# Patient Record
Sex: Female | Born: 1965 | Hispanic: Yes | Marital: Married | Smoking: Never smoker
Health system: Southern US, Community
[De-identification: ages and names within clinical notes are randomized; demographics above are authoritative.]

## PROBLEM LIST (undated history)

## (undated) DIAGNOSIS — M1990 Unspecified osteoarthritis, unspecified site: Secondary | ICD-10-CM

## (undated) DIAGNOSIS — E039 Hypothyroidism, unspecified: Secondary | ICD-10-CM

## (undated) DIAGNOSIS — E079 Disorder of thyroid, unspecified: Secondary | ICD-10-CM

## (undated) DIAGNOSIS — K219 Gastro-esophageal reflux disease without esophagitis: Secondary | ICD-10-CM

## (undated) HISTORY — PX: OTHER SURGICAL HISTORY: SHX169

## (undated) HISTORY — PX: KNEE ARTHROSCOPY: SUR90

---

## 2009-06-18 ENCOUNTER — Emergency Department: Payer: Self-pay | Admitting: Emergency Medicine

## 2009-11-05 ENCOUNTER — Ambulatory Visit: Payer: Self-pay | Admitting: Family Medicine

## 2011-01-04 ENCOUNTER — Ambulatory Visit: Payer: Self-pay

## 2011-08-18 ENCOUNTER — Ambulatory Visit: Payer: Self-pay | Admitting: Primary Care

## 2012-02-13 ENCOUNTER — Emergency Department: Payer: Self-pay | Admitting: Emergency Medicine

## 2012-05-02 ENCOUNTER — Ambulatory Visit: Payer: Self-pay | Admitting: Primary Care

## 2012-05-19 ENCOUNTER — Ambulatory Visit: Payer: Self-pay | Admitting: Primary Care

## 2012-10-30 ENCOUNTER — Ambulatory Visit: Payer: Self-pay | Admitting: Orthopedic Surgery

## 2012-11-06 ENCOUNTER — Ambulatory Visit: Payer: Self-pay | Admitting: Orthopedic Surgery

## 2014-05-09 NOTE — Op Note (Signed)
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   PROCEDURE: Right knee arthroscopy, partial medial meniscectomy.   ANESTHESIA: General.   SURGEON: Kennedy BuckerMichael Dauntae Derusha, M.D.   DESCRIPTION OF PROCEDURE: The patient was brought to the operating room and after adequate anesthesia was obtained, the right leg was prepped and draped in the usual sterile fashion with arthroscopic legholder and tourniquet applied. The tourniquet was not required during the procedure.   After patient identification and timeout procedures were completed, an inferolateral portal was made and the arthroscope was introduced. Initial inspection revealed moderate patellofemoral degenerative change on both patella and trochlea. There are no loose bodies present within the suprapatellar pouch. Coming around medially, there was a small plica band but it did not appear to impinge on any of the articular cartilage and was left alone. Coming around into the medial compartment , an inferomedial portal was made.   On probing, there was a degenerative tear involving most of the posterior third of the meniscus as well as a parrot-beak-type tear at the junction of the middle and posterior thirds of the meniscus that could be displaced into the joint. There was partial-thickness loss of the articular cartilage on both femoral and tibial sides with fissuring but no exposed bone. The ACL was intact and the lateral compartment was essentially normal with normal meniscus to probing and normal-appearing articular cartilage.   The gutters were free of any loose bodies. A meniscal punch was used to debride the meniscus back to stable margin with a side biter to get the flap tear at the junction of the medial and posterior thirds.  After this was completed, an ArthroCare wand was used to smooth the edges of this meniscal tear, preprocedure and postprocedure pictures having been obtained.   An arthroscopic cannula was placed through the inferomedial portal at this time to thoroughly irrigate out the knee and remove meniscal fragments. After thorough irrigation, all instrumentation was withdrawn and the knee closed with simple interrupted 4-0 nylon. Then, 20 mL of 0.5% Sensorcaine with epinephrine was infiltrated into the area of portals for postoperative analgesia. Xeroform, 4 x 4's, Webril and Ace wrap were applied.   ESTIMATED BLOOD LOSS: Minimal.   COMPLICATIONS: None.   SPECIMEN: None.    ____________________________ Samantha SchullerMichael J. Omer Monter, MD mjm:np D: 11/06/2012 21:00:42 ET T: 11/06/2012 21:46:34 ET JOB#: [id number]  cc: Samantha SchullerMichael J. Lloyd Cullinan, MD, <Dictator> Samantha SchullerMICHAEL J Zaccheaus Storlie MD ELECTRONICALLY SIGNED 11/07/2012 7:52

## 2014-12-17 ENCOUNTER — Emergency Department
Admission: EM | Admit: 2014-12-17 | Discharge: 2014-12-17 | Disposition: A | Discharge status: Home / Self Care | Payer: Self-pay | Attending: Emergency Medicine | Admitting: Emergency Medicine

## 2014-12-17 ENCOUNTER — Emergency Department: Payer: Self-pay

## 2014-12-17 DIAGNOSIS — M25552 Pain in left hip: Secondary | ICD-10-CM | POA: Insufficient documentation

## 2014-12-17 DIAGNOSIS — J209 Acute bronchitis, unspecified: Secondary | ICD-10-CM | POA: Insufficient documentation

## 2014-12-17 HISTORY — DX: Disorder of thyroid, unspecified: E07.9

## 2014-12-17 MED ORDER — GUAIFENESIN-CODEINE 100-10 MG/5ML PO SOLN: 10.0000 mL | ORAL | Status: DC | PRN

## 2014-12-17 MED ORDER — AZITHROMYCIN 250 MG PO TABS: ORAL_TABLET | ORAL | Status: DC

## 2014-12-17 MED ORDER — MELOXICAM 15 MG PO TABS: 15.0000 mg | ORAL_TABLET | Freq: Every day | ORAL | Status: DC

## 2014-12-17 NOTE — Discharge Instructions (Signed)
Bronquitis aguda (Acute Bronchitis) La bronquitis es una inflamacin de las vas respiratorias que se extienden desde la trquea [facility] pulmones (bronquios). La inflamacin produce la formacin de mucosidad. Esto produce tos, que es el sntoma ms frecuente de la bronquitis.  Cuando la bronquitis es [place], generalmente comienza de [place] sbita y desaparece luego de un par de semanas. El hbito de fumar, las alergias y el asma pueden empeorar la bronquitis. Los episodios repetidos de bronquitis pueden causar ms problemas pulmonares.  CAUSAS La causa ms frecuente de bronquitis aguda es el mismo virus que produce el resfro. El virus puede propagarse de Ardelia Mems persona a la otra (contagioso) a travs de la tos y los estornudos, y al tocar objetos contaminados. [place].  Cristy Hilts.  Tos con mucosidad.  Dolores [employer] cuerpo.  Congestin en el pecho.  Escalofros.  Falta de aire.  Dolor de Investment banker, operational. DIAGNSTICO  La bronquitis aguda en general se diagnostica con un examen fsico. El mdico tambin le har preguntas sobre su historia clnica. En algunos casos se indican otros estudios, como radiografas, para Clinical research associate.  TRATAMIENTO  La bronquitis aguda generalmente desaparece en un par de semanas. Con frecuencia, no es Systems analyst. Los medicamentos se indican para aliviar la fiebre o la tos. Generalmente, no es necesario el uso de antibiticos, pero pueden recetarse en ciertas ocasiones. En algunos casos, se recomienda el uso de un inhalador para mejorar la falta de aire y Aeronautical engineer tos. Un vaporizador de aire fro podr ayudarlo a [employer] bronquiales y Armed forces technical officer su eliminacin.  INSTRUCCIONES PARA EL CUIDADO EN EL HOGAR  Descanse lo suficiente.  Beba lquidos en abundancia para mantener la orina de color claro o amarillo plido (excepto que padezca una enfermedad que requiera la restriccin de lquidos). El aumento  de lquidos puede ayudar a que las secreciones respiratorias (esputo) sean menos espesas y a reducir la congestin del pecho, y Mining engineer deshidratacin.  Tome los medicamentos solamente como se lo haya indicado el mdico.  Si le recetaron antibiticos, asegrese de terminarlos, incluso si comienza a sentirse mejor.  Evite fumar o aspirar el humo de otros fumadores. La exposicin al humo del cigarrillo o a irritantes qumicos har que la bronquitis empeore. Si fuma, considere el uso de goma de Higher education careers adviser o la aplicacin de parches en la piel que contengan nicotina para Public house manager los sntomas de abstinencia. Si deja de fumar, sus pulmones se curarn ms rpido.  Reduzca la probabilidad de otro episodio de bronquitis aguda lavando sus manos con frecuencia, evitando a las personas que tengan sntomas y tratando de no tocarse las manos con la boca, la nariz o los ojos.  Concurra a todas las visitas de control como se lo haya indicado el mdico. SOLICITE ATENCIN MDICA SI: Los sntomas no mejoran despus de una semana de [place].  SOLICITE ATENCIN MDICA DE INMEDIATO SI:  Comienza a tener fiebre o escalofros cada vez ms intensos.  Siente dolor en el pecho.  Le falta el aire de manera preocupante.  La flema tiene [place].  Se deshidrata.  Se desmaya o siente que va a desmayarse de forma repetida.  Tiene vmitos que se repiten.  Tiene un dolor de cabeza intenso. ASEGRESE DE QUE:   Comprende estas instrucciones.  Controlar su afeccin.  Recibir ayuda de inmediato si no mejora o si empeora.   Esta informacin no tiene Marine scientist el consejo del mdico. Asegrese de hacerle al mdico cualquier  pregunta que tenga.   Document Released: 01/03/2005 Document Revised: 01/24/2014 Elsevier Interactive Patient Education 2016 [employer]  Dolor de cadera (Hip Pain) La cadera es la articulacin entre la parte superior de las piernas y la parte inferior de la pelvis. Los  TransMontaignehuesos, Research scientist (physical sciences)el cartlago, los tendones y los msculos de la articulacin de la cadera trabajan arduamente cada da para sostener el peso del cuerpo y Nurse, children'spermitir el desplazamiento. El Engineer, miningdolor de cadera puede tener distintos grados, desde un dolor leve hasta un dolor intenso en uno o ambos lados de la cadera. El dolor puede sentirse en la parte interna de la articulacin de la cadera, cerca de la ingle, o en la parte externa, cerca de los glteos y la parte superior de los muslos. Tambin puede estar acompaado de hinchazn o entumecimiento.  INSTRUCCIONES PARA EL CUIDADO EN EL HOGAR   Tome los medicamentos solamente como se lo haya indicado el mdico.  Aplique hielo sobre la zona lesionada.  Ponga el hielo en una bolsa plstica.  Coloque una toalla entre la piel y la bolsa de hielo.  Aplique el hielo de 3 a 4 veces por da, durante 15 a 20minutos en cada aplicacin.  Mantenga la pierna levantada (elevada) siempre que sea posible, para reducir la hinchazn.  Evite las actividades que Teaching laboratory techniciancausan dolor.  Siga los ejercicios especficos segn las indicaciones del mdico.  Duerma con una almohada entre las piernas del lado que le sea ms cmodo.  Anote la frecuencia con la que tiene dolor en la cadera, la ubicacin del dolor y lo que siente. SOLICITE ATENCIN MDICA SI:   No puede apoyar el peso del cuerpo DTE Energy Companysobre la pierna.  La cadera est enrojecida o hinchada, o muy dolorida con la palpacin.  El dolor o la hinchazn continan o empeoran despus de 1semana.  Tiene una creciente dificultad para caminar.  Tiene fiebre. SOLICITE ATENCIN MDICA DE INMEDIATO SI:   Se ha cado.  El dolor y la hinchazn de la cadera aumentan de repente. ASEGRESE DE QUE:   Comprende estas instrucciones.  Controlar su afeccin.  Recibir ayuda de inmediato si no mejora o si empeora.   Esta informacin no tiene Theme park managercomo fin reemplazar el consejo del mdico. Asegrese de hacerle al mdico cualquier pregunta que  tenga.   Document Released: 05/20/2013 Elsevier Interactive Patient Education [employer]2016 Elsevier Inc.

## 2014-12-17 NOTE — ED Notes (Signed)
Pt arrives POV c/o coughing and left sided hip pain when coughing. Pt states that it began last night. Pt reports yellow mucous production when coughing. Pt alert and oriented X4, active, cooperative, pt in NAD. RR even and unlabored, color WNL.

## 2014-12-17 NOTE — ED Provider Notes (Signed)
[facility] Emergency Department Provider Note  ____________________________________________  Time seen: Approximately 7:38 AM  I have reviewed the triage vital signs and the nursing notes.   HISTORY  Chief Complaint Cough and Hip Pain    HPI Samantha Avery is a 49 y.o. female who presents for evaluation of coughing associated with left-sided hip pain. States it began 3 days ago with yellow mucus production. Denies a runny nose congestion or sore throat. Denies any fever chills.   Past Medical History  Diagnosis Date  . Thyroid disease     There are no active problems to display for this patient.   History reviewed. No pertinent past surgical history.  Current Outpatient Rx  Name  Route  Sig  Dispense  Refill  . azithromycin (ZITHROMAX Z-PAK) 250 MG tablet      Take 2 tablets (500 mg) on  Day 1,  followed by 1 tablet (250 mg) once daily on Days 2 through 5.   6 each   0   . guaiFENesin-codeine 100-10 MG/5ML syrup   Oral   Take 10 mLs by mouth every 4 (four) hours as needed for cough.   180 mL   0   . meloxicam (MOBIC) 15 MG tablet   Oral   Take 1 tablet (15 mg total) by mouth daily.   30 tablet   0     Allergies Review of patient's allergies indicates no known allergies.  No family history on file.  Social History Social History  Substance Use Topics  . Smoking status: Never Smoker   . Smokeless tobacco: None  . Alcohol Use: No    Review of Systems Constitutional: No fever/chills Eyes: No visual changes. ENT: No sore throat. Cardiovascular: Denies chest pain. Respiratory: Denies shortness of breath. Positive for cough Gastrointestinal: No abdominal pain.  No nausea, no vomiting.  No diarrhea.  No constipation. Genitourinary: Negative for dysuria. Musculoskeletal: Positive for left hip pain Skin: Negative for rash. Neurological: Negative for headaches, focal weakness or numbness.  10-point ROS otherwise  negative.  ____________________________________________   PHYSICAL EXAM:  VITAL SIGNS: ED Triage Vitals  Enc Vitals Group     BP 12/17/14 0732 147/83 mmHg     Pulse Rate 12/17/14 0732 80     Resp 12/17/14 0732 18     Temp 12/17/14 0732 98.3 F (36.8 C)     Temp Source 12/17/14 0732 Oral     SpO2 12/17/14 0732 97 %     Weight 12/17/14 0732 210 lb (95.255 kg)     Height 12/17/14 0732 5' (1.524 m)     Head Cir --      Peak Flow --      Pain Score 12/17/14 0732 9     Pain Loc --      Pain Edu? --      Excl. in GC? --     Constitutional: Alert and oriented. Well appearing and in no acute distress. Eyes: Conjunctivae are normal. PERRL. EOMI. Head: Atraumatic. Nose: No congestion/rhinnorhea. Mouth/Throat: Mucous membranes are moist.  Oropharynx non-erythematous. Neck: No stridor.   Cardiovascular: Normal rate, regular rhythm. Grossly normal heart sounds.  Good peripheral circulation. Respiratory: Normal respiratory effort.  No retractions. Lungs CTAB. Gastrointestinal: Soft and nontender. No distention. No abdominal bruits. No CVA tenderness. Musculoskeletal: Point tenderness left outer hip. Neurologic:  Normal speech and language. No gross focal neurologic deficits are appreciated. No gait instability. Skin:  Skin is warm, dry and intact. No rash  noted. Psychiatric: Mood and affect are normal. Speech and behavior are normal.  ____________________________________________   LABS (all labs ordered are listed, but only abnormal results are displayed)  Labs Reviewed - No data to display ____________________________________________  RADIOLOGY  Acute bronchitic changes. No pneumonia or atelectasis. ____________________________________________   PROCEDURES  Procedure(s) performed: None  Critical Care performed: No  ____________________________________________   INITIAL IMPRESSION / ASSESSMENT AND PLAN / ED COURSE  Pertinent labs & imaging results that were  available during my care of the patient were reviewed by me and considered in my medical decision making (see chart for details).  Acute bronchitis with left hip pain. Rx given for Zithromax, Robitussin-AC, and meloxicam. Patient to follow up with PCP or return to the ER with any worsening symptomology. Patient voices no other emergency medical complaints at this visit. ____________________________________________   FINAL CLINICAL IMPRESSION(S) / ED DIAGNOSES  Final diagnoses:  Acute bronchitis, unspecified organism  Arthralgia of left hip      Evangeline Dakin, PA-C 12/17/14 [id number]  Arnaldo Natal, MD 12/19/14 [phone]

## 2015-10-08 ENCOUNTER — Other Ambulatory Visit: Payer: Self-pay | Admitting: Family Medicine

## 2015-10-08 DIAGNOSIS — Z1231 Encounter for screening mammogram for malignant neoplasm of breast: Secondary | ICD-10-CM

## 2015-10-28 ENCOUNTER — Ambulatory Visit
Admission: RE | Admit: 2015-10-28 | Discharge: 2015-10-28 | Disposition: A | Discharge status: Home / Self Care | Payer: 59 | Source: Ambulatory Visit | Attending: Family Medicine | Admitting: Family Medicine

## 2015-10-28 DIAGNOSIS — Z1231 Encounter for screening mammogram for malignant neoplasm of breast: Secondary | ICD-10-CM | POA: Diagnosis not present

## 2017-10-07 ENCOUNTER — Other Ambulatory Visit
Admission: RE | Admit: 2017-10-07 | Discharge: 2017-10-07 | Disposition: A | Discharge status: Home / Self Care | Payer: 59 | Source: Ambulatory Visit | Attending: Pediatrics | Admitting: Pediatrics

## 2017-10-07 DIAGNOSIS — I83891 Varicose veins of right lower extremities with other complications: Secondary | ICD-10-CM | POA: Diagnosis not present

## 2017-10-07 LAB — FIBRIN DERIVATIVES D-DIMER (ARMC ONLY): FIBRIN DERIVATIVES D-DIMER (ARMC): 401.81 ng{FEU}/mL (ref 0.00–499.00)

## 2017-10-30 ENCOUNTER — Other Ambulatory Visit: Payer: Self-pay | Admitting: Orthopedic Surgery

## 2017-10-30 DIAGNOSIS — M25561 Pain in right knee: Principal | ICD-10-CM

## 2017-10-30 DIAGNOSIS — G8929 Other chronic pain: Secondary | ICD-10-CM

## 2017-10-30 DIAGNOSIS — M1711 Unilateral primary osteoarthritis, right knee: Secondary | ICD-10-CM

## 2017-11-09 ENCOUNTER — Other Ambulatory Visit: Payer: Self-pay | Admitting: Internal Medicine

## 2017-11-09 ENCOUNTER — Ambulatory Visit
Admission: RE | Admit: 2017-11-09 | Discharge: 2017-11-09 | Disposition: A | Discharge status: Home / Self Care | Payer: 59 | Source: Ambulatory Visit | Attending: Internal Medicine | Admitting: Internal Medicine

## 2017-11-09 DIAGNOSIS — R05 Cough: Secondary | ICD-10-CM | POA: Insufficient documentation

## 2017-11-09 DIAGNOSIS — R053 Chronic cough: Secondary | ICD-10-CM

## 2017-12-06 ENCOUNTER — Ambulatory Visit
Admission: RE | Admit: 2017-12-06 | Discharge: 2017-12-06 | Disposition: A | Discharge status: Home / Self Care | Payer: 59 | Source: Ambulatory Visit | Attending: Orthopedic Surgery | Admitting: Orthopedic Surgery

## 2017-12-06 ENCOUNTER — Ambulatory Visit: Admission: RE | Admit: 2017-12-06 | Payer: 59 | Source: Ambulatory Visit

## 2017-12-06 DIAGNOSIS — M25561 Pain in right knee: Secondary | ICD-10-CM | POA: Insufficient documentation

## 2017-12-06 DIAGNOSIS — M1711 Unilateral primary osteoarthritis, right knee: Secondary | ICD-10-CM | POA: Insufficient documentation

## 2017-12-06 DIAGNOSIS — G8929 Other chronic pain: Secondary | ICD-10-CM

## 2018-01-19 ENCOUNTER — Encounter
Admission: RE | Admit: 2018-01-19 | Discharge: 2018-01-19 | Disposition: A | Discharge status: Home / Self Care | Payer: 59 | Source: Ambulatory Visit | Attending: Orthopedic Surgery | Admitting: Orthopedic Surgery

## 2018-01-19 ENCOUNTER — Other Ambulatory Visit: Payer: Self-pay

## 2018-01-19 DIAGNOSIS — Z01812 Encounter for preprocedural laboratory examination: Secondary | ICD-10-CM | POA: Diagnosis present

## 2018-01-19 HISTORY — DX: Hypothyroidism, unspecified: E03.9

## 2018-01-19 HISTORY — DX: Unspecified osteoarthritis, unspecified site: M19.90

## 2018-01-19 LAB — CBC
HEMATOCRIT: 41.8 % (ref 36.0–46.0)
Hemoglobin: 13.2 g/dL (ref 12.0–15.0)
MCH: 27.8 pg (ref 26.0–34.0)
MCHC: 31.6 g/dL (ref 30.0–36.0)
MCV: 88.2 fL (ref 80.0–100.0)
Platelets: 372 10*3/uL (ref 150–400)
RBC: 4.74 MIL/uL (ref 3.87–5.11)
RDW: 13.8 % (ref 11.5–15.5)
WBC: 10.1 10*3/uL (ref 4.0–10.5)
nRBC: 0 % (ref 0.0–0.2)

## 2018-01-19 LAB — BASIC METABOLIC PANEL
Anion gap: 9 (ref 5–15)
BUN: 10 mg/dL (ref 6–20)
CALCIUM: 8.9 mg/dL (ref 8.9–10.3)
CO2: 24 mmol/L (ref 22–32)
CREATININE: 0.53 mg/dL (ref 0.44–1.00)
Chloride: 107 mmol/L (ref 98–111)
GFR calc non Af Amer: >60 mL/min (ref >60)
Glucose, Bld: 82 mg/dL (ref 70–99)
Potassium: 3.6 mmol/L (ref 3.5–5.1)
Sodium: 140 mmol/L (ref 135–145)

## 2018-01-19 LAB — TYPE AND SCREEN
ABO/RH(D): A POS
ANTIBODY SCREEN: NEGATIVE

## 2018-01-19 LAB — URINALYSIS, ROUTINE W REFLEX MICROSCOPIC
Bilirubin Urine: NEGATIVE
GLUCOSE, UA: NEGATIVE mg/dL
Hgb urine dipstick: NEGATIVE
KETONES UR: NEGATIVE mg/dL
LEUKOCYTES UA: NEGATIVE
Nitrite: NEGATIVE
PH: 6 (ref 5.0–8.0)
Protein, ur: NEGATIVE mg/dL
SPECIFIC GRAVITY, URINE: 1.005 (ref 1.005–1.030)

## 2018-01-19 LAB — PROTIME-INR
INR: 0.97
Prothrombin Time: 12.8 seconds (ref 11.4–15.2)

## 2018-01-19 LAB — APTT: aPTT: 30 seconds (ref 24–36)

## 2018-01-19 LAB — SURGICAL PCR SCREEN
MRSA, PCR: NEGATIVE
Staphylococcus aureus: NEGATIVE

## 2018-01-19 LAB — SEDIMENTATION RATE: SED RATE: 11 mm/h (ref 0–30)

## 2018-01-19 NOTE — Patient Instructions (Signed)
Your procedure is scheduled on: Thurs. 01/25/18 Su procedimiento est programado para: Report to Day Surgery. Presntese a: To find out your arrival time please call [phone] between 1PM - 3PM on Wed. 01/24/18. Para saber su hora de llegada por favor llame al [phone] entre la 1PM - 3PM el da:   Remember: Instructions that are not followed completely may result in serious medical risk, up to and including death,  or upon the discretion of your surgeon and anesthesiologist your surgery may need to be rescheduled.  Recuerde: Las instrucciones que no se siguen completamente Armed forces logistics/support/administrative officer en un riesgo de salud grave, incluyendo hasta  la [place] o a discrecin de su cirujano y Scientific laboratory technician, su ciruga se puede posponer.   __X_ 1.Do not eat food after midnight the night before your procedure. No    gum chewing or hard candies. You may drink clear liquids up to 2 hours     before you are scheduled to arrive for your surgery- DO not drink clear     Liquids within 2 hours of the start of your surgery.     Clear Liquids include:    water, apple juice without pulp, clear carbohydrate drink such as    Clearfast of [place]ade, Black Coffee or Tea (Do not add anything to coffee or tea).      No coma nada despus de la medianoche de la noche anterior a su    procedimiento. No coma chicles ni caramelos duros. Puede tomar    lquidos claros hasta 2 horas antes de su hora programada de llegada al     hospital para su procedimiento. No tome lquidos claros durante el     transcurso de las 2 horas de su llegada programada al hospital para su     procedimiento, ya que esto puede llevar a que su procedimiento se    retrase o tenga que volver a Magazine features editor.  Los lquidos claros incluyen:          - Agua o jugo de [place] sin pulpa          - Bebidas claras con carbohidratos como ClearFast o Gatorade          - Caf negro o t claro (sin leche, sin cremas, no agregue nada al caf ni al t)  No  tome nada que no est en esta lista.  Los pacientes con diabetes tipo 1 y tipo 2 solo deben Printmaker.  Llame a la clnica de PreCare o a la unidad de Same Day Surgery si  tiene alguna pregunta sobre estas instrucciones.              _X__ 2.Do Not Smoke or use e-cigarettes For 24 Hours Prior to Your Surgery.    Do not use any chewable tobacco products for at least 6   hours prior to surgery.    No fume ni use cigarrillos electrnicos durante las 24 horas previas    a su [place].  No use ningn producto de tabaco masticable durante   al menos 6 horas antes de la [place].     __X_ 3. No alcohol for 24 hours before or after surgery.    No tome alcohol durante las 24 horas antes ni despus de la [place].   ____4. Bring all medications with you on the day of surgery if instructed.    Lleve todos los medicamentos con usted el da de su ciruga si se le    ha indicado as.  _x___ 5. Notify your doctor if there is any change in your medical condition (cold,fever, infections).    Informe a su mdico si hay algn cambio en su condicin mdica  (resfriado, fiebre, infecciones).   Do not wear jewelry, make-up, hairpins, clips or nail polish.  No use joyas, maquillajes, pinzas/ganchos para el cabello ni esmalte de uas.  Do not wear lotions, powders, or perfumes. You may wear deodorant.  No use lociones, polvos o perfumes.  Puede usar desodorante.    Do not shave 48 hours prior to surgery. Men may shave face and neck.  No se afeite 48 horas antes de la [place].  Los hombres pueden [employer] cara  y el cuello.   Do not bring valuables to the hospital.   No lleve objetos de valor al hospital.  [facility] is not responsible for any belongings or valuables.  [place] no se hace responsable de ningn tipo de pertenencias u objetos de Licensed conveyancer.               Contacts, dentures or bridgework may not be worn into surgery.  Los lentes de [place], las dentaduras postizas o puentes no se pueden  usar en la [place].   Leave your suitcase in the car. After surgery it may be brought to your room.  Deje su maleta en el auto.  Despus de la ciruga podr traerla a su habitacin.   For patients admitted to the hospital, discharge time is determined by your  treatment team.  Para los pacientes que sean ingresados al hospital, el tiempo en el cual se le  dar de alta es determinado por su equipo de [place].   Patients discharged the day of surgery will not be allowed to drive home. A los pacientes que se les da de alta el mismo da de la ciruga no se les permitir conducir a Higher education careers adviser.   Please read over the following fact sheets that you were given: Por favor lea las siguientes hojas de informacin que le dieron:      __x__ Take these medicines the morning of surgery with A SIP OF WATER:          [place] medicinas la maana de la ciruga con UN SORBO DE AGUA:  1. levothyroxine (SYNTHROID, LEVOTHROID) 100 MCG tablet  2.   3.   4.       5.  6.  ____ Fleet Enema (as directed)          Enema de Fleet (segn lo indicado)    _x___ Use CHG Soap as directed          Utilice el jabn de CHG segn lo indicado  ____ Use inhalers on the day of surgery          Use los inhaladores el da de la ciruga  ____ Stop metformin 2 days prior to surgery          Deje de tomar el metformin 2 das antes de la ciruga    ____ Take 1/2 of usual insulin dose the night before surgery and none on the morning of surgery           Tome la mitad de la dosis habitual de insulina la noche antes de la [place] y no tome nada en la maana de la             ciruga  ____ Stop Coumadin/Plavix/aspirin on           Deje de tomar  el Coumadin/Plavix/aspirina el da:  ____ Stop Anti-inflammatories on           Deje de tomar antiinflamatorios el da:   ____ Stop supplements until after surgery            Deje de tomar suplementos hasta despus de la ciruga  ____ Bring C-Pap to the hospital           Sylvan CheeseLleve el C-Pap al hospital    Nurse Shawna OrleansMelanie [phone]  Medication with dosage

## 2018-01-20 LAB — URINE CULTURE: CULTURE: NO GROWTH

## 2018-01-24 MED ORDER — CEFAZOLIN SODIUM-DEXTROSE 2-4 GM/100ML-% IV SOLN
2.0000 g | Freq: Once | INTRAVENOUS | Status: AC
  Administered 2018-01-25: 2 g via INTRAVENOUS

## 2018-01-24 MED ORDER — TRANEXAMIC ACID-NACL 1000-0.7 MG/100ML-% IV SOLN
1000.0000 mg | INTRAVENOUS | Status: DC
  Filled 2018-01-24: qty 100

## 2018-01-25 ENCOUNTER — Inpatient Hospital Stay: Payer: 59 | Admitting: Certified Registered"

## 2018-01-25 ENCOUNTER — Other Ambulatory Visit: Payer: Self-pay

## 2018-01-25 ENCOUNTER — Encounter
Admission: RE | Disposition: A | Discharge status: Home Health | Payer: Self-pay | Source: Home / Self Care | Attending: Orthopedic Surgery

## 2018-01-25 ENCOUNTER — Inpatient Hospital Stay
Admission: RE | Admit: 2018-01-25 | Discharge: 2018-01-30 | DRG: 470 | Disposition: A | Discharge status: Home Health | Payer: 59 | Attending: Orthopedic Surgery | Admitting: Orthopedic Surgery

## 2018-01-25 ENCOUNTER — Inpatient Hospital Stay: Payer: 59

## 2018-01-25 ENCOUNTER — Encounter: Payer: Self-pay | Admitting: *Deleted

## 2018-01-25 DIAGNOSIS — E039 Hypothyroidism, unspecified: Secondary | ICD-10-CM | POA: Diagnosis present

## 2018-01-25 DIAGNOSIS — M1711 Unilateral primary osteoarthritis, right knee: Secondary | ICD-10-CM | POA: Diagnosis present

## 2018-01-25 DIAGNOSIS — Z79899 Other long term (current) drug therapy: Secondary | ICD-10-CM | POA: Diagnosis not present

## 2018-01-25 DIAGNOSIS — I2699 Other pulmonary embolism without acute cor pulmonale: Secondary | ICD-10-CM | POA: Diagnosis not present

## 2018-01-25 DIAGNOSIS — Z6841 Body Mass Index (BMI) 40.0 and over, adult: Secondary | ICD-10-CM | POA: Diagnosis not present

## 2018-01-25 DIAGNOSIS — Z96651 Presence of right artificial knee joint: Secondary | ICD-10-CM

## 2018-01-25 DIAGNOSIS — G8918 Other acute postprocedural pain: Secondary | ICD-10-CM

## 2018-01-25 DIAGNOSIS — K219 Gastro-esophageal reflux disease without esophagitis: Secondary | ICD-10-CM | POA: Diagnosis present

## 2018-01-25 HISTORY — DX: Gastro-esophageal reflux disease without esophagitis: K21.9

## 2018-01-25 HISTORY — PX: TOTAL KNEE ARTHROPLASTY: SHX125

## 2018-01-25 LAB — POCT PREGNANCY, URINE: PREG TEST UR: NEGATIVE

## 2018-01-25 LAB — ABO/RH: ABO/RH(D): A POS

## 2018-01-25 SURGERY — ARTHROPLASTY, KNEE, TOTAL
Anesthesia: Spinal | Laterality: Right

## 2018-01-25 MED ORDER — METHOCARBAMOL 500 MG PO TABS
500.0000 mg | ORAL_TABLET | Freq: Four times a day (QID) | ORAL | Status: DC | PRN
  Administered 2018-01-25 – 2018-01-26 (×2): 500 mg via ORAL
  Filled 2018-01-25 (×2): qty 1

## 2018-01-25 MED ORDER — MORPHINE SULFATE (PF) 10 MG/ML IV SOLN
INTRAVENOUS | Status: AC
  Filled 2018-01-25: qty 1

## 2018-01-25 MED ORDER — ZOLPIDEM TARTRATE 5 MG PO TABS: 5.0000 mg | ORAL_TABLET | Freq: Every evening | ORAL | Status: DC | PRN

## 2018-01-25 MED ORDER — PHENYLEPHRINE HCL 10 MG/ML IJ SOLN
INTRAMUSCULAR | Status: AC
  Filled 2018-01-25: qty 1

## 2018-01-25 MED ORDER — GENTAMICIN SULFATE 40 MG/ML IJ SOLN
INTRAMUSCULAR | Status: AC
  Filled 2018-01-25: qty 14

## 2018-01-25 MED ORDER — ACETAMINOPHEN 500 MG PO TABS
1000.0000 mg | ORAL_TABLET | Freq: Four times a day (QID) | ORAL | Status: AC
  Administered 2018-01-25 – 2018-01-26 (×4): 1000 mg via ORAL
  Filled 2018-01-25 (×4): qty 2

## 2018-01-25 MED ORDER — MIDAZOLAM HCL 2 MG/2ML IJ SOLN
INTRAMUSCULAR | Status: AC
  Filled 2018-01-25: qty 2

## 2018-01-25 MED ORDER — FAMOTIDINE 20 MG PO TABS
20.0000 mg | ORAL_TABLET | Freq: Once | ORAL | Status: AC
  Administered 2018-01-25: 20 mg via ORAL

## 2018-01-25 MED ORDER — KETOROLAC TROMETHAMINE 30 MG/ML IJ SOLN
INTRAMUSCULAR | Status: AC
  Filled 2018-01-25: qty 1

## 2018-01-25 MED ORDER — SODIUM CHLORIDE 0.9 % IV SOLN
INTRAVENOUS | Status: DC | PRN
  Administered 2018-01-25: 60 mL

## 2018-01-25 MED ORDER — ALUM & MAG HYDROXIDE-SIMETH 200-200-20 MG/5ML PO SUSP: 30.0000 mL | ORAL | Status: DC | PRN

## 2018-01-25 MED ORDER — SODIUM CHLORIDE (PF) 0.9 % IJ SOLN
INTRAMUSCULAR | Status: AC
  Filled 2018-01-25: qty 100

## 2018-01-25 MED ORDER — NEOMYCIN-POLYMYXIN B GU 40-200000 IR SOLN
Status: AC
  Filled 2018-01-25: qty 1

## 2018-01-25 MED ORDER — FAMOTIDINE 20 MG PO TABS
ORAL_TABLET | ORAL | Status: AC
  Filled 2018-01-25: qty 1

## 2018-01-25 MED ORDER — PROPOFOL 500 MG/50ML IV EMUL
INTRAVENOUS | Status: DC | PRN
  Administered 2018-01-25: 35 ug/kg/min via INTRAVENOUS

## 2018-01-25 MED ORDER — NORETHINDRONE ACETATE 5 MG PO TABS
5.0000 mg | ORAL_TABLET | Freq: Every day | ORAL | Status: DC
  Administered 2018-01-25 – 2018-01-30 (×6): 5 mg via ORAL
  Filled 2018-01-25 (×6): qty 1

## 2018-01-25 MED ORDER — LACTATED RINGERS IV SOLN
INTRAVENOUS | Status: DC
  Administered 2018-01-25: 07:00:00 via INTRAVENOUS

## 2018-01-25 MED ORDER — PROPOFOL 10 MG/ML IV BOLUS
INTRAVENOUS | Status: DC | PRN
  Administered 2018-01-25: 30 mg via INTRAVENOUS

## 2018-01-25 MED ORDER — PROMETHAZINE HCL 25 MG/ML IJ SOLN: 6.2500 mg | INTRAMUSCULAR | Status: DC | PRN

## 2018-01-25 MED ORDER — LEVOTHYROXINE SODIUM 100 MCG PO TABS
100.0000 ug | ORAL_TABLET | Freq: Every day | ORAL | Status: DC
  Administered 2018-01-26 – 2018-01-30 (×5): 100 ug via ORAL
  Filled 2018-01-25 (×6): qty 1

## 2018-01-25 MED ORDER — EPHEDRINE SULFATE 50 MG/ML IJ SOLN
INTRAMUSCULAR | Status: DC | PRN
  Administered 2018-01-25: 10 mg via INTRAVENOUS

## 2018-01-25 MED ORDER — PANTOPRAZOLE SODIUM 40 MG PO TBEC
40.0000 mg | DELAYED_RELEASE_TABLET | Freq: Every day | ORAL | Status: DC
  Administered 2018-01-25 – 2018-01-30 (×6): 40 mg via ORAL
  Filled 2018-01-25 (×6): qty 1

## 2018-01-25 MED ORDER — PROPOFOL 500 MG/50ML IV EMUL
INTRAVENOUS | Status: AC
  Filled 2018-01-25: qty 50

## 2018-01-25 MED ORDER — LIDOCAINE HCL (PF) 2 % IJ SOLN
INTRAMUSCULAR | Status: DC | PRN
  Administered 2018-01-25: 50 mg

## 2018-01-25 MED ORDER — MAGNESIUM HYDROXIDE 400 MG/5ML PO SUSP
30.0000 mL | Freq: Every day | ORAL | Status: DC | PRN
  Administered 2018-01-26 – 2018-01-29 (×2): 30 mL via ORAL
  Filled 2018-01-25 (×2): qty 30

## 2018-01-25 MED ORDER — OXYCODONE HCL 5 MG PO TABS
5.0000 mg | ORAL_TABLET | ORAL | Status: DC | PRN
  Administered 2018-01-26 – 2018-01-30 (×6): 10 mg via ORAL
  Filled 2018-01-25 (×4): qty 2
  Filled 2018-01-25: qty 1
  Filled 2018-01-25: qty 2
  Filled 2018-01-25: qty 1
  Filled 2018-01-25: qty 2

## 2018-01-25 MED ORDER — BUPIVACAINE HCL (PF) 0.5 % IJ SOLN
INTRAMUSCULAR | Status: DC | PRN
  Administered 2018-01-25: 3 mL via INTRATHECAL

## 2018-01-25 MED ORDER — SODIUM CHLORIDE 0.9 % IV SOLN
INTRAVENOUS | Status: DC
  Administered 2018-01-25 – 2018-01-26 (×2): via INTRAVENOUS

## 2018-01-25 MED ORDER — CEFAZOLIN SODIUM-DEXTROSE 2-4 GM/100ML-% IV SOLN
INTRAVENOUS | Status: AC
  Filled 2018-01-25: qty 100

## 2018-01-25 MED ORDER — OXYCODONE HCL 5 MG PO TABS
10.0000 mg | ORAL_TABLET | ORAL | Status: DC | PRN
  Administered 2018-01-25: 10 mg via ORAL
  Administered 2018-01-26 – 2018-01-29 (×7): 15 mg via ORAL
  Filled 2018-01-25 (×8): qty 3

## 2018-01-25 MED ORDER — PHENOL 1.4 % MT LIQD
1.0000 | OROMUCOSAL | Status: DC | PRN
  Filled 2018-01-25: qty 177

## 2018-01-25 MED ORDER — LIDOCAINE HCL (PF) 2 % IJ SOLN
INTRAMUSCULAR | Status: AC
  Filled 2018-01-25: qty 10

## 2018-01-25 MED ORDER — CEFAZOLIN SODIUM-DEXTROSE 2-4 GM/100ML-% IV SOLN
2.0000 g | Freq: Four times a day (QID) | INTRAVENOUS | Status: AC
  Administered 2018-01-25 (×3): 2 g via INTRAVENOUS
  Filled 2018-01-25 (×3): qty 100

## 2018-01-25 MED ORDER — BUPIVACAINE LIPOSOME 1.3 % IJ SUSP
INTRAMUSCULAR | Status: AC
  Filled 2018-01-25: qty 20

## 2018-01-25 MED ORDER — MORPHINE SULFATE 10 MG/ML IJ SOLN
INTRAMUSCULAR | Status: DC | PRN
  Administered 2018-01-25: 10 mg

## 2018-01-25 MED ORDER — FENTANYL CITRATE (PF) 100 MCG/2ML IJ SOLN
INTRAMUSCULAR | Status: AC
  Filled 2018-01-25: qty 2

## 2018-01-25 MED ORDER — ONDANSETRON HCL 4 MG/2ML IJ SOLN
4.0000 mg | Freq: Four times a day (QID) | INTRAMUSCULAR | Status: DC | PRN
  Administered 2018-01-25: 4 mg via INTRAVENOUS
  Filled 2018-01-25: qty 2

## 2018-01-25 MED ORDER — PHENYLEPHRINE HCL 10 MG/ML IJ SOLN
INTRAMUSCULAR | Status: DC | PRN
  Administered 2018-01-25 (×4): 100 ug via INTRAVENOUS

## 2018-01-25 MED ORDER — BISACODYL 5 MG PO TBEC
5.0000 mg | DELAYED_RELEASE_TABLET | Freq: Every day | ORAL | Status: DC | PRN
  Administered 2018-01-26 – 2018-01-27 (×2): 5 mg via ORAL
  Filled 2018-01-25 (×2): qty 1

## 2018-01-25 MED ORDER — LIDOCAINE HCL URETHRAL/MUCOSAL 2 % EX GEL
CUTANEOUS | Status: AC
  Filled 2018-01-25: qty 5

## 2018-01-25 MED ORDER — FENTANYL CITRATE (PF) 100 MCG/2ML IJ SOLN
25.0000 ug | INTRAMUSCULAR | Status: DC | PRN
  Administered 2018-01-25: 25 ug via INTRAVENOUS

## 2018-01-25 MED ORDER — TRANEXAMIC ACID-NACL 1000-0.7 MG/100ML-% IV SOLN
INTRAVENOUS | Status: DC | PRN
  Administered 2018-01-25: 1000 mg via INTRAVENOUS

## 2018-01-25 MED ORDER — MIDAZOLAM HCL 5 MG/5ML IJ SOLN
INTRAMUSCULAR | Status: DC | PRN
  Administered 2018-01-25 (×2): 2 mg via INTRAVENOUS

## 2018-01-25 MED ORDER — METOCLOPRAMIDE HCL 5 MG/ML IJ SOLN: 5.0000 mg | Freq: Three times a day (TID) | INTRAMUSCULAR | Status: DC | PRN

## 2018-01-25 MED ORDER — GABAPENTIN 300 MG PO CAPS
300.0000 mg | ORAL_CAPSULE | Freq: Three times a day (TID) | ORAL | Status: DC
  Administered 2018-01-25 – 2018-01-30 (×16): 300 mg via ORAL
  Filled 2018-01-25 (×16): qty 1

## 2018-01-25 MED ORDER — ONDANSETRON HCL 4 MG PO TABS: 4.0000 mg | ORAL_TABLET | Freq: Four times a day (QID) | ORAL | Status: DC | PRN

## 2018-01-25 MED ORDER — TRAMADOL HCL 50 MG PO TABS
50.0000 mg | ORAL_TABLET | Freq: Four times a day (QID) | ORAL | Status: DC
  Administered 2018-01-25 – 2018-01-30 (×18): 50 mg via ORAL
  Filled 2018-01-25 (×19): qty 1

## 2018-01-25 MED ORDER — FENTANYL CITRATE (PF) 100 MCG/2ML IJ SOLN
INTRAMUSCULAR | Status: DC | PRN
  Administered 2018-01-25 (×2): 50 ug via INTRAVENOUS

## 2018-01-25 MED ORDER — HYDROMORPHONE HCL 1 MG/ML IJ SOLN: 0.5000 mg | INTRAMUSCULAR | Status: DC | PRN

## 2018-01-25 MED ORDER — KETOROLAC TROMETHAMINE 30 MG/ML IJ SOLN
INTRAMUSCULAR | Status: DC | PRN
  Administered 2018-01-25: 30 mg

## 2018-01-25 MED ORDER — BUPIVACAINE-EPINEPHRINE (PF) 0.25% -1:200000 IJ SOLN
INTRAMUSCULAR | Status: AC
  Filled 2018-01-25: qty 30

## 2018-01-25 MED ORDER — DOCUSATE SODIUM 100 MG PO CAPS
100.0000 mg | ORAL_CAPSULE | Freq: Two times a day (BID) | ORAL | Status: DC
  Administered 2018-01-25 – 2018-01-30 (×11): 100 mg via ORAL
  Filled 2018-01-25 (×11): qty 1

## 2018-01-25 MED ORDER — DIPHENHYDRAMINE HCL 12.5 MG/5ML PO ELIX
12.5000 mg | ORAL_SOLUTION | ORAL | Status: DC | PRN
  Administered 2018-01-27 – 2018-01-28 (×2): 12.5 mg via ORAL
  Filled 2018-01-25 (×2): qty 5

## 2018-01-25 MED ORDER — ASPIRIN 81 MG PO CHEW
81.0000 mg | CHEWABLE_TABLET | Freq: Two times a day (BID) | ORAL | Status: DC
  Administered 2018-01-26 – 2018-01-30 (×9): 81 mg via ORAL
  Filled 2018-01-25 (×9): qty 1

## 2018-01-25 MED ORDER — METOCLOPRAMIDE HCL 10 MG PO TABS: 5.0000 mg | ORAL_TABLET | Freq: Three times a day (TID) | ORAL | Status: DC | PRN

## 2018-01-25 MED ORDER — ACETAMINOPHEN 325 MG PO TABS
325.0000 mg | ORAL_TABLET | Freq: Four times a day (QID) | ORAL | Status: DC | PRN
  Administered 2018-01-28: 650 mg via ORAL
  Filled 2018-01-25: qty 2

## 2018-01-25 MED ORDER — MENTHOL 3 MG MT LOZG
1.0000 | LOZENGE | OROMUCOSAL | Status: DC | PRN
  Filled 2018-01-25: qty 9

## 2018-01-25 MED ORDER — METHOCARBAMOL 1000 MG/10ML IJ SOLN
500.0000 mg | Freq: Four times a day (QID) | INTRAVENOUS | Status: DC | PRN
  Filled 2018-01-25: qty 5

## 2018-01-25 MED ORDER — SODIUM CHLORIDE 0.9 % IV SOLN
INTRAVENOUS | Status: DC | PRN
  Administered 2018-01-25: 25 ug/min via INTRAVENOUS

## 2018-01-25 MED ORDER — GLYCOPYRROLATE 0.2 MG/ML IJ SOLN
INTRAMUSCULAR | Status: AC
  Filled 2018-01-25: qty 1

## 2018-01-25 MED ORDER — GLYCOPYRROLATE 0.2 MG/ML IJ SOLN
INTRAMUSCULAR | Status: DC | PRN
  Administered 2018-01-25: 0.2 mg via INTRAVENOUS

## 2018-01-25 MED ORDER — BUPIVACAINE-EPINEPHRINE (PF) 0.25% -1:200000 IJ SOLN
INTRAMUSCULAR | Status: DC | PRN
  Administered 2018-01-25: 30 mL

## 2018-01-25 SURGICAL SUPPLY — 68 items
BANDAGE ACE 6X5 VEL STRL LF (GAUZE/BANDAGES/DRESSINGS) ×3 IMPLANT
BLADE SAW 1 (BLADE) ×3 IMPLANT
BLOCK CUTTING FEMUR 3 RT MED (MISCELLANEOUS) ×2 IMPLANT
BLOCK CUTTING FEMUR 3+ RT MED (MISCELLANEOUS) ×2 IMPLANT
BLOCK CUTTING TIBIAL 3 RT (MISCELLANEOUS) ×2 IMPLANT
BLOCK CUTTING TIBIAL 4 RT MIS (MISCELLANEOUS) ×2 IMPLANT
CANISTER SUCT 1200ML W/VALVE (MISCELLANEOUS) ×3 IMPLANT
CANISTER SUCT 3000ML PPV (MISCELLANEOUS) ×6 IMPLANT
CEMENT HV SMART SET (Cement) ×6 IMPLANT
CHLORAPREP W/TINT 26ML (MISCELLANEOUS) ×6 IMPLANT
COOLER POLAR GLACIER W/PUMP (MISCELLANEOUS) ×3 IMPLANT
COVER WAND RF STERILE (DRAPES) ×3 IMPLANT
CUFF TOURN 24 STER (MISCELLANEOUS) IMPLANT
CUFF TOURN 30 STER DUAL PORT (MISCELLANEOUS) IMPLANT
DRAPE SHEET LG 3/4 BI-LAMINATE (DRAPES) ×6 IMPLANT
ELECT CAUTERY BLADE 6.4 (BLADE) ×3 IMPLANT
ELECT REM PT RETURN 9FT ADLT (ELECTROSURGICAL) ×3
ELECTRODE REM PT RTRN 9FT ADLT (ELECTROSURGICAL) ×1 IMPLANT
FEMORAL COMP SZ3P RT SPHERE (Femur) ×2 IMPLANT
FEMUR BONE MODEL (MISCELLANEOUS) ×2 IMPLANT
GAUZE PETRO XEROFOAM 1X8 (MISCELLANEOUS) ×3 IMPLANT
GAUZE SPONGE 4X4 12PLY STRL (GAUZE/BANDAGES/DRESSINGS) ×3 IMPLANT
GLOVE BIOGEL PI IND STRL 9 (GLOVE) ×1 IMPLANT
GLOVE BIOGEL PI INDICATOR 9 (GLOVE) ×2
GLOVE INDICATOR 8.0 STRL GRN (GLOVE) ×3 IMPLANT
GLOVE SURG ORTHO 8.0 STRL STRW (GLOVE) ×3 IMPLANT
GLOVE SURG SYN 9.0  PF PI (GLOVE) ×2
GLOVE SURG SYN 9.0 PF PI (GLOVE) ×1 IMPLANT
GOWN SRG 2XL LVL 4 RGLN SLV (GOWNS) ×1 IMPLANT
GOWN STRL NON-REIN 2XL LVL4 (GOWNS) ×2
GOWN STRL REUS W/ TWL LRG LVL3 (GOWN DISPOSABLE) ×1 IMPLANT
GOWN STRL REUS W/ TWL XL LVL3 (GOWN DISPOSABLE) ×1 IMPLANT
GOWN STRL REUS W/TWL LRG LVL3 (GOWN DISPOSABLE) ×2
GOWN STRL REUS W/TWL XL LVL3 (GOWN DISPOSABLE) ×2
HOLDER FOLEY CATH W/STRAP (MISCELLANEOUS) ×3 IMPLANT
HOOD PEEL AWAY FLYTE STAYCOOL (MISCELLANEOUS) ×6 IMPLANT
INSERT TIBIAL SZ 3 RT FLEX (Insert) ×2 IMPLANT
KIT TURNOVER KIT A (KITS) ×3 IMPLANT
KNIFE SCULPS 14X20 (INSTRUMENTS) ×3 IMPLANT
NDL SAFETY ECLIPSE 18X1.5 (NEEDLE) ×1 IMPLANT
NDL SPNL 18GX3.5 QUINCKE PK (NEEDLE) ×1 IMPLANT
NDL SPNL 20GX3.5 QUINCKE YW (NEEDLE) ×1 IMPLANT
NEEDLE HYPO 18GX1.5 SHARP (NEEDLE) ×2
NEEDLE SPNL 18GX3.5 QUINCKE PK (NEEDLE) ×3 IMPLANT
NEEDLE SPNL 20GX3.5 QUINCKE YW (NEEDLE) ×3 IMPLANT
NS IRRIG 1000ML POUR BTL (IV SOLUTION) ×3 IMPLANT
PACK TOTAL KNEE (MISCELLANEOUS) ×3 IMPLANT
PAD WRAPON POLAR KNEE (MISCELLANEOUS) ×1 IMPLANT
PATELLA RESURFACING MEDACTA 02 (Bone Implant) ×2 IMPLANT
PULSAVAC PLUS IRRIG FAN TIP (DISPOSABLE) ×3
SCALPEL PROTECTED #10 DISP (BLADE) ×6 IMPLANT
SOL .9 NS 3000ML IRR  AL (IV SOLUTION) ×2
SOL .9 NS 3000ML IRR UROMATIC (IV SOLUTION) ×1 IMPLANT
STAPLER SKIN PROX 35W (STAPLE) ×3 IMPLANT
STEM EXTENSION 11MMX30MM (Stem) ×2 IMPLANT
SUCTION FRAZIER HANDLE 10FR (MISCELLANEOUS) ×2
SUCTION TUBE FRAZIER 10FR DISP (MISCELLANEOUS) ×1 IMPLANT
SUT DVC 2 QUILL PDO  T11 36X36 (SUTURE) ×2
SUT DVC 2 QUILL PDO T11 36X36 (SUTURE) ×1 IMPLANT
SUT V-LOC 90 ABS DVC 3-0 CL (SUTURE) ×3 IMPLANT
SYR 20CC LL (SYRINGE) ×3 IMPLANT
SYR 50ML LL SCALE MARK (SYRINGE) ×6 IMPLANT
TIP FAN IRRIG PULSAVAC PLUS (DISPOSABLE) ×1 IMPLANT
TOWEL OR 17X26 4PK STRL BLUE (TOWEL DISPOSABLE) ×3 IMPLANT
TOWER CARTRIDGE SMART MIX (DISPOSABLE) ×3 IMPLANT
TRAY FOLEY MTR SLVR 16FR STAT (SET/KITS/TRAYS/PACK) ×3 IMPLANT
TRAY TIB FX RT SZ 3 (Joint) ×2 IMPLANT
WRAPON POLAR PAD KNEE (MISCELLANEOUS) ×3

## 2018-01-25 NOTE — Care Management Note (Signed)
Case Management Note  Patient Details  Name: Samantha Avery MRN: [id number] Date of Birth: 08/13/1965  Subjective/Objective:                   Met with patient and Daughter to discuss discharge Patient has a rolling walker at home She needs a 3 in 1 for home [place] with [facility] that she needs a 3 in 1 [facility] Patient uses [facility] and can afford medications Patient has transportation Patient lives with 2 daughters and 3 grand children Patient has follow up appointment with surgeon   Action/Plan:  Notified [facility] jason that patiwent needs a 3 in 1  Expected Discharge Date:                  Expected Discharge Plan:     In-House Referral:     Discharge planning Services  CM Consult  Post Acute Care Choice:  Durable Medical Equipment Choice offered to:     DME Arranged:  3-N-1 DME Agency:  [place]:  PT [place]:  [facility] [employer]  Status of Service:  In process, will continue to follow  If discussed at Long Length of Stay Meetings, dates discussed:    Additional Comments:  Su Hilt, RN 01/25/2018, 1:57 PM

## 2018-01-25 NOTE — Evaluation (Signed)
Physical Therapy Evaluation Patient Details Name: Samantha JaegerJuliana G Avery MRN: [id number] DOB: July 06, 1965 Today's Date: 01/25/2018   History of Present Illness  Pt is a 53 y/o F s/p R TKA.    Clinical Impression  Pt is s/p TKA resulting in the deficits listed below (see PT Problem List). Pt was very pleasant and reports PTA was ambulating without AD with no recent falls.  She was ind with her ADLs as well.  Pt currently requires min guard for safety with all aspects of mobility.  Verbal and tactile cues provided for safety with transfers and improved gait mechanics while ambulating.  Need to clarify if pt has a walker at home or not.  Would recommend Bari RW as pt's body habitus precludes pt's ability to ambulate inside of BOS of regular RW.  Pt will benefit from skilled PT to increase their independence and safety with mobility to allow discharge to the venue listed below.     Follow Up Recommendations Outpatient PT    Equipment Recommendations  Other (comment)(Bari RW, need to clarify if pt already has one or not)    Recommendations for Other Services OT consult     Precautions / Restrictions Precautions Precautions: Fall;Knee Precaution Comments: Instructed pt in no pillow under knee Restrictions Weight Bearing Restrictions: Yes RLE Weight Bearing: Weight bearing as tolerated      Mobility  Bed Mobility Overal bed mobility: Needs Assistance Bed Mobility: Supine to Sit     Supine to sit: Min guard;HOB elevated     General bed mobility comments: Increased time and effort with use of bed rail.  Cues for sequencing.   Transfers Overall transfer level: Needs assistance Equipment used: Rolling walker (2 wheeled) Transfers: Sit to/from Stand Sit to Stand: Min guard         General transfer comment: Cues for proper hand placement with sit<>stand and to keep RW with her for stand>sit.   Ambulation/Gait Ambulation/Gait assistance: Min guard Gait Distance (Feet): 100  Feet Assistive device: Rolling walker (2 wheeled) Gait Pattern/deviations: Step-to pattern;Decreased stance time - right;Decreased step length - left;Decreased weight shift to right;Antalgic Gait velocity: decreased   General Gait Details: Cues for improved R knee F.  Pt remains steady using RW.    Stairs            Wheelchair Mobility    Modified Rankin (Stroke Patients Only)       Balance Overall balance assessment: Needs assistance Sitting-balance support: No upper extremity supported;Feet supported Sitting balance-Leahy Scale: Good     Standing balance support: Single extremity supported;During functional activity Standing balance-Leahy Scale: Poor Standing balance comment: Pt relies on at least 1UE support for static and dynamic activity                             Pertinent Vitals/Pain Pain Assessment: Faces Faces Pain Scale: Hurts even more Pain Location: R knee Pain Descriptors / Indicators: Grimacing;Guarding;Moaning Pain Intervention(s): Limited activity within patient's tolerance;Monitored during session;Repositioned;Premedicated before session;Utilized relaxation techniques;Ice applied    Home Living Family/patient expects to be discharged to:: Private residence Living Arrangements: Children;Other relatives(two daughters and three grandchildren) Available Help at Discharge: Family;Available 24 hours/day(daughter taking off work to stay with pt at d/c) Type of Home: Apartment Home Access: Level entry     Home Layout: One level        Prior Function Level of Independence: Independent         Comments: Ambulating  without AD.  No recent falls.  Ind with ADLs.      Hand Dominance        Extremity/Trunk Assessment   Upper Extremity Assessment Upper Extremity Assessment: Overall WFL for tasks assessed    Lower Extremity Assessment Lower Extremity Assessment: RLE deficits/detail RLE Deficits / Details: Unable to formally assess  s/p R TKA.  Functionall strength is at least 3/5       Communication   Communication: Prefers language other than English(Spanish speaking)  Cognition Arousal/Alertness: Awake/alert Behavior During Therapy: WFL for tasks assessed/performed Overall Cognitive Status: Within Functional Limits for tasks assessed                                        General Comments General comments (skin integrity, edema, etc.): Phone interpreter utilized: ZOXWRUE #[id number] [id number]    Exercises Total Joint Exercises Ankle Circles/Pumps: AROM;Both;10 reps;Supine Quad Sets: Strengthening;Both;10 reps;Supine Knee Flexion: AAROM;Right;Other reps (comment);Seated(1 rep with 15 second hold) Goniometric ROM: -8 to 74 deg   Assessment/Plan    PT Assessment Patient needs continued PT services  PT Problem List Decreased strength;Decreased range of motion;Decreased activity tolerance;Decreased balance;Decreased mobility;Pain;Decreased knowledge of use of DME;Decreased safety awareness;Decreased knowledge of precautions       PT Treatment Interventions DME instruction;Gait training;Functional mobility training;Therapeutic activities;Therapeutic exercise;Balance training;Neuromuscular re-education;Patient/family education;Modalities    PT Goals (Current goals can be found in the Care Plan section)  Acute Rehab PT Goals Patient Stated Goal: to improve independence PT Goal Formulation: With patient Time For Goal Achievement: 02/08/18 Potential to Achieve Goals: Good    Frequency BID   Barriers to discharge        Co-evaluation               AM-PAC PT "6 Clicks" Mobility  Outcome Measure Help needed turning from your back to your side while in a flat bed without using bedrails?: A Little Help needed moving from lying on your back to sitting on the side of a flat bed without using bedrails?: A Little Help needed moving to and from a bed to a chair (including a wheelchair)?: A Little Help  needed standing up from a chair using your arms (e.g., wheelchair or bedside chair)?: A Little Help needed to walk in hospital room?: A Little Help needed climbing 3-5 steps with a railing? : A Little 6 Click Score: 18    End of Session Equipment Utilized During Treatment: Gait belt Activity Tolerance: Patient tolerated treatment well Patient left: in chair;with call bell/phone within reach;with chair alarm set;with family/visitor present;Other (comment)(polar care and towel roll) Nurse Communication: Mobility status PT Visit Diagnosis: Pain;Other abnormalities of gait and mobility (R26.89);Unsteadiness on feet (R26.81);Muscle weakness (generalized) (M62.81) Pain - Right/Left: Right Pain - part of body: Knee    Time: 1450-1549 PT Time Calculation (min) (ACUTE ONLY): 59 min   Charges:   PT Evaluation $PT Eval Low Complexity: 1 Low PT Treatments $Gait Training: 8-22 mins $Therapeutic Exercise: 8-22 mins $Therapeutic Activity: 8-22 mins        Encarnacion ChuAshley  PT, DPT 01/25/2018, 4:28 PM

## 2018-01-25 NOTE — [place] FL2 (Signed)
[place] MEDICAID FL2 LEVEL OF CARE SCREENING TOOL     IDENTIFICATION  Patient Name: Samantha JaegerJuliana G Vasquez-Riverol Birthdate: 1965/05/05 Sex: female Admission Date (Current Location): 01/25/2018  [place]   Facility and Address:  [facility], [address], [place], [place] [address]      Provider Number: [id number]  Attending Physician Name and Address:  Kennedy BuckerMenz, Michael, MD  Relative Name and Phone Number:       Current Level of Care: Hospital Recommended Level of Care: Skilled Nursing Facility Prior Approval Number:    Date Approved/Denied:   PASRR Number: [id number])  Discharge Plan: SNF    Current Diagnoses: Patient Active Problem List   Diagnosis Date Noted  . S/P TKR (total knee replacement) using cement, right 01/25/2018    Orientation RESPIRATION BLADDER Height & Weight     Self, Time, Situation, Place  Normal Continent Weight: 239 lb 13.8 oz (108.8 kg) Height:  5' (152.4 cm)  BEHAVIORAL SYMPTOMS/MOOD NEUROLOGICAL BOWEL NUTRITION STATUS      Continent Diet(Diet: Regular )  AMBULATORY STATUS COMMUNICATION OF NEEDS Skin   Extensive Assist Verbally Surgical wounds, Wound Vac(Incision: Right Knee, Provena Wound Vac. )                       Personal Care Assistance Level of Assistance  Bathing, Feeding, Dressing Bathing Assistance: Limited assistance Feeding assistance: Independent Dressing Assistance: Limited assistance     Functional Limitations Info  Sight, Hearing, Speech Sight Info: Adequate Hearing Info: Adequate Speech Info: Adequate    SPECIAL CARE FACTORS FREQUENCY  PT (By licensed PT), OT (By licensed OT)     PT Frequency: (5) OT Frequency: (5)            Contractures      Additional Factors Info  Code Status, Allergies Code Status Info: (Full Code. ) Allergies Info: (No Known Allegies. )           Current Medications (01/25/2018):  This is the current hospital active  medication list Current Facility-Administered Medications  Medication Dose Route Frequency Provider Last Rate Last Dose  . 0.9 %  sodium chloride infusion   Intravenous Continuous Kennedy BuckerMenz, Michael, MD 75 mL/hr at 01/25/18 1121    . acetaminophen (TYLENOL) tablet 1,000 mg  1,000 mg Oral Q6H Kennedy BuckerMenz, Michael, MD   1,000 mg at 01/25/18 1125  . [START ON 01/26/2018] acetaminophen (TYLENOL) tablet 325-650 mg  325-650 mg Oral Q6H PRN Kennedy BuckerMenz, Michael, MD      . alum & mag hydroxide-simeth (MAALOX/MYLANTA) 200-200-20 MG/5ML suspension 30 mL  30 mL Oral Q4H PRN Kennedy BuckerMenz, Michael, MD      . aspirin chewable tablet 81 mg  81 mg Oral BID Kennedy BuckerMenz, Michael, MD      . bisacodyl (DULCOLAX) EC tablet 5 mg  5 mg Oral Daily PRN Kennedy BuckerMenz, Michael, MD      . ceFAZolin (ANCEF) IVPB 2g/100 mL premix  2 g Intravenous Q6H Kennedy BuckerMenz, Michael, MD 200 mL/hr at 01/25/18 1124 2 g at 01/25/18 1124  . diphenhydrAMINE (BENADRYL) 12.5 MG/5ML elixir 12.5-25 mg  12.5-25 mg Oral Q4H PRN Kennedy BuckerMenz, Michael, MD      . docusate sodium (COLACE) capsule 100 mg  100 mg Oral BID Kennedy BuckerMenz, Michael, MD   100 mg at 01/25/18 1126  . famotidine (PEPCID) 20 MG tablet           . fentaNYL (SUBLIMAZE) 100 MCG/2ML injection           .  gabapentin (NEURONTIN) capsule 300 mg  300 mg Oral TID Kennedy BuckerMenz, Michael, MD      . HYDROmorphone (DILAUDID) injection 0.5-1 mg  0.5-1 mg Intravenous Q4H PRN Kennedy BuckerMenz, Michael, MD      . levothyroxine (SYNTHROID, LEVOTHROID) tablet 100 mcg  100 mcg Oral Q0600 Kennedy BuckerMenz, Michael, MD      . magnesium hydroxide (MILK OF MAGNESIA) suspension 30 mL  30 mL Oral Daily PRN Kennedy BuckerMenz, Michael, MD      . menthol-cetylpyridinium (CEPACOL) lozenge 3 mg  1 lozenge Oral PRN Kennedy BuckerMenz, Michael, MD       Or  . phenol (CHLORASEPTIC) mouth spray 1 spray  1 spray Mouth/Throat PRN Kennedy BuckerMenz, Michael, MD      . methocarbamol (ROBAXIN) tablet 500 mg  500 mg Oral Q6H PRN Kennedy BuckerMenz, Michael, MD       Or  . methocarbamol (ROBAXIN) 500 mg in dextrose 5 % 50 mL IVPB  500 mg Intravenous Q6H PRN Kennedy BuckerMenz,  Michael, MD      . metoCLOPramide (REGLAN) tablet 5-10 mg  5-10 mg Oral Q8H PRN Kennedy BuckerMenz, Michael, MD       Or  . metoCLOPramide (REGLAN) injection 5-10 mg  5-10 mg Intravenous Q8H PRN Kennedy BuckerMenz, Michael, MD      . norethindrone (AYGESTIN) tablet 5 mg  5 mg Oral Daily Kennedy BuckerMenz, Michael, MD      . ondansetron [facility](ZOFRAN) tablet 4 mg  4 mg Oral Q6H PRN Kennedy BuckerMenz, Michael, MD       Or  . ondansetron [facility](ZOFRAN) injection 4 mg  4 mg Intravenous Q6H PRN Kennedy BuckerMenz, Michael, MD      . oxyCODONE (Oxy IR/ROXICODONE) immediate release tablet 10-15 mg  10-15 mg Oral Q4H PRN Kennedy BuckerMenz, Michael, MD      . oxyCODONE (Oxy IR/ROXICODONE) immediate release tablet 5-10 mg  5-10 mg Oral Q4H PRN Kennedy BuckerMenz, Michael, MD      . pantoprazole (PROTONIX) EC tablet 40 mg  40 mg Oral Daily Kennedy BuckerMenz, Michael, MD   40 mg at 01/25/18 1127  . traMADol (ULTRAM) tablet 50 mg  50 mg Oral Q6H Kennedy BuckerMenz, Michael, MD   50 mg at 01/25/18 1125  . zolpidem (AMBIEN) tablet 5 mg  5 mg Oral QHS PRN Kennedy BuckerMenz, Michael, MD         Discharge Medications: Please see discharge summary for a list of discharge medications.  Relevant Imaging Results:  Relevant Lab Results:   Additional Information (SSN: [id number])  Vilda Zollner, Darleen CrockerBailey M, LCSW

## 2018-01-25 NOTE — Progress Notes (Signed)
Spanish interpreter at bedside during PACU stay.

## 2018-01-25 NOTE — H&P (Signed)
Reviewed paper H+P, will be scanned into chart. No changes noted.  

## 2018-01-25 NOTE — Op Note (Signed)
01/25/2018  9:25 AM  PATIENT:  Samantha Avery  53 y.o. female  PRE-OPERATIVE DIAGNOSIS:  PRIMARY OSTEOARTHRITIS OF RIGHT KNEE  POST-OPERATIVE DIAGNOSIS:  PRIMARY OSTEOARTHRITIS OF RIGHT KNEE  PROCEDURE:  Procedure(s): TOTAL KNEE ARTHROPLASTY (Right)  SURGEON: Leitha Schuller, MD  Assistant Cranston Neighbor, PA-C  ANESTHESIA:   spinal  EBL:  Total I/O In: 800 [I.V.:800] Out: 250 [Urine:200; Blood:50]  BLOOD ADMINISTERED:none  DRAINS: none   LOCAL MEDICATIONS USED:  MARCAINE    and OTHER Toradol and morphine Exparel  SPECIMEN:  No Specimen  DISPOSITION OF SPECIMEN:  N/A  COUNTS:  YES  TOURNIQUET: 66 minutes at 300 mmHg  IMPLANTS: Medacta GM K sphere system right 3+ femur 3 tibia, short stem with 11 mm insert and 2 patella, all components cemented  DICTATION: .Dragon Dictation   patient was brought to the operating room and after adequatespinalanesthesia was obtained therightleg was prepped and draped in the usual sterile fashion. After patient identification and timeout procedure were completed, a midline skin incision was made followed by a medial parapatellar arthrotomy with a tourniquet raised after bone cuts were started. There is mildsynovitis throughout the knee with eburnated bone in themedial compartment and patellofemoral joint with heldarthritis to thelateral compartment. The fat pad and ACL and PCL were excised. The proximal tibia was exposed and the proximal tibia MYKNEEcutting guide applied and proximal tibia cut carried out. The distal femoral cut was carried out with theMYKNEEguideas well and adequate resection noted for theextension gap. The size3+cutting guide was applied,anterior posterior andchamfer cuts made on the femur with the meniscusremoved at this time. With the PCL completelyreally released the 3tibiatemplate applied to the tibia the proximal tibia was prepared for short stem. trials placed, 3+femur placed with a  11 mm insert giving good stability throughout a range of motion and full extension obtained without difficulty. The distal femoral drill holes were made withcut for the trochlear groove made long with PS drill hole. These trials were removed and the patella was cut using the patellar cutting guide drill holes were made in the patella sized to a size 2. the bony surfaces were thoroughly irrigated and dried. The tibial component was cemented to place first with thepolyethylene was placed with set screw tightened with a torque screwdriver. The femoral component was placed with excess cement being removed and the knee held in extension as the patellar button was clamped into place. When the cement set the excess amount around the periphery was removed and the patella tracked well . The tourniquet was let down and the knee thoroughly irrigated. The wound was closed with a heavy Quill for the arthrotomy followed by a 3-0 v-LOC subcuticular closure followed by skin staples and covered with incisional wound VAC andPolar Care  PLAN OF CARE: Admit to inpatient   PATIENT DISPOSITION:  PACU - hemodynamically stable.

## 2018-01-25 NOTE — Anesthesia Preprocedure Evaluation (Addendum)
Anesthesia Evaluation  Patient identified by MRN, date of birth, ID band Patient awake    Reviewed: Allergy & Precautions, H&P , NPO status , Patient's Chart, lab work & pertinent test results, reviewed documented beta blocker date and time   History of Anesthesia Complications Negative for: history of anesthetic complications  Airway Mallampati: II  TM Distance: >3 FB Neck ROM: full    Dental  (+) Dental Advidsory Given, Partial Upper   Pulmonary neg shortness of breath, neg sleep apnea, neg COPD, Recent URI , Resolved,           Cardiovascular Exercise Tolerance: Good negative cardio ROS       Neuro/Psych negative neurological ROS  negative psych ROS   GI/Hepatic Neg liver ROS, GERD  ,  Endo/Other  neg diabetesHypothyroidism Morbid obesity  Renal/GU negative Renal ROS  negative genitourinary   Musculoskeletal   Abdominal   Peds  Hematology negative hematology ROS (+)   Anesthesia Other Findings Past Medical History: No date: Arthritis No date: GERD (gastroesophageal reflux disease) No date: Hypothyroidism No date: Thyroid disease   Reproductive/Obstetrics negative OB ROS                             Anesthesia Physical Anesthesia Plan  ASA: III  Anesthesia Plan: Spinal   Post-op Pain Management:    Induction:   PONV Risk Score and Plan: Propofol infusion and TIVA  Airway Management Planned: Natural Airway and Simple Face Mask  Additional Equipment:   Intra-op Plan:   Post-operative Plan:   Informed Consent: I have reviewed the patients History and Physical, chart, labs and discussed the procedure including the risks, benefits and alternatives for the proposed anesthesia with the patient or authorized representative who has indicated his/her understanding and acceptance.   Dental Advisory Given  Plan Discussed with: Anesthesiologist, CRNA and Surgeon  Anesthesia  Plan Comments:        Anesthesia Quick Evaluation

## 2018-01-25 NOTE — Transfer of Care (Signed)
Immediate Anesthesia Transfer of Care Note  Patient: Samantha Avery  Procedure(s) Performed: TOTAL KNEE ARTHROPLASTY (Right )  Patient Location: PACU  Anesthesia Type:Spinal  Level of Consciousness: awake and alert   Airway & Oxygen Therapy: Patient Spontanous Breathing and Patient connected to face mask oxygen  Post-op Assessment: Report given to RN and Post -op Vital signs reviewed and stable  Post vital signs: Reviewed  Last Vitals:  Vitals Value Taken Time  BP 80/53 01/25/2018  9:24 AM  Temp    Pulse 73 01/25/2018  9:25 AM  Resp 16 01/25/2018  9:25 AM  SpO2 100 % 01/25/2018  9:25 AM  Vitals shown include unvalidated device data.  Last Pain:  Vitals:   01/25/18 0606  TempSrc: Tympanic  PainSc: 5          Complications: No apparent anesthesia complications

## 2018-01-25 NOTE — Anesthesia Post-op Follow-up Note (Signed)
Anesthesia QCDR form completed.        

## 2018-01-25 NOTE — Anesthesia Procedure Notes (Signed)
Spinal  Patient location during procedure: OR Staffing Anesthesiologist: Karenz, Andrew, MD Resident/CRNA: Ndea Kilroy, CRNA Performed: resident/CRNA  Preanesthetic Checklist Completed: patient identified, site marked, surgical consent, pre-op evaluation, timeout performed, IV checked, risks and benefits discussed and monitors and equipment checked Spinal Block Patient position: sitting Prep: ChloraPrep and site prepped and draped Patient monitoring: heart rate, continuous pulse ox, blood pressure and cardiac monitor Approach: midline Location: L4-5 Injection technique: single-shot Needle Needle type: Introducer and Pencan  Needle gauge: 24 G Needle length: 9 cm Additional Notes Negative paresthesia. Negative blood return. Positive free-flowing CSF. Expiration date of kit checked and confirmed. Patient tolerated procedure well, without complications.       

## 2018-01-26 ENCOUNTER — Encounter: Payer: Self-pay | Admitting: Orthopedic Surgery

## 2018-01-26 LAB — CBC
HCT: 35.2 % — ABNORMAL LOW (ref 36.0–46.0)
Hemoglobin: 11 g/dL — ABNORMAL LOW (ref 12.0–15.0)
MCH: 27.9 pg (ref 26.0–34.0)
MCHC: 31.3 g/dL (ref 30.0–36.0)
MCV: 89.3 fL (ref 80.0–100.0)
PLATELETS: 289 10*3/uL (ref 150–400)
RBC: 3.94 MIL/uL (ref 3.87–5.11)
RDW: 13.9 % (ref 11.5–15.5)
WBC: 11.2 10*3/uL — ABNORMAL HIGH (ref 4.0–10.5)
nRBC: 0 % (ref 0.0–0.2)

## 2018-01-26 LAB — BASIC METABOLIC PANEL
Anion gap: 5 (ref 5–15)
BUN: 8 mg/dL (ref 6–20)
CO2: 25 mmol/L (ref 22–32)
CREATININE: 0.55 mg/dL (ref 0.44–1.00)
Calcium: 8.2 mg/dL — ABNORMAL LOW (ref 8.9–10.3)
Chloride: 106 mmol/L (ref 98–111)
GFR calc Af Amer: >60 mL/min (ref >60)
GFR calc non Af Amer: >60 mL/min (ref >60)
GLUCOSE: 115 mg/dL — AB (ref 70–99)
Potassium: 3.7 mmol/L (ref 3.5–5.1)
Sodium: 136 mmol/L (ref 135–145)

## 2018-01-26 MED ORDER — BISACODYL 5 MG PO TBEC: 5.0000 mg | DELAYED_RELEASE_TABLET | Freq: Every day | ORAL | 0 refills | Status: AC | PRN

## 2018-01-26 MED ORDER — ASPIRIN 81 MG PO CHEW: 81.0000 mg | CHEWABLE_TABLET | Freq: Two times a day (BID) | ORAL | 0 refills | Status: DC

## 2018-01-26 MED ORDER — OXYCODONE HCL 5 MG PO TABS: 5.0000 mg | ORAL_TABLET | ORAL | 0 refills | Status: AC | PRN

## 2018-01-26 NOTE — Progress Notes (Signed)
Clinical Social Worker (CSW) received SNF consult. PT is recommending outpatient PT. RN case manager aware of above. Please reconsult if future social work needs arise. CSW signing off.   Giulianna Rocha, LCSW [phone]  

## 2018-01-26 NOTE — Care Management (Signed)
RNCM spoke with patient's daughter to confirm that Bedside commode has been delivered by [facility]- it has.  Patient would like to continue with plan for home health PT through [facility].

## 2018-01-26 NOTE — Anesthesia Postprocedure Evaluation (Signed)
Anesthesia Post Note  Patient: Samantha Avery  Procedure(s) Performed: TOTAL KNEE ARTHROPLASTY (Right )  Patient location during evaluation: Nursing Unit Anesthesia Type: Spinal Level of consciousness: oriented and awake and alert Pain management: pain level controlled Vital Signs Assessment: post-procedure vital signs reviewed and stable Respiratory status: spontaneous breathing and respiratory function stable Cardiovascular status: blood pressure returned to baseline and stable Postop Assessment: no headache, no backache, no apparent nausea or vomiting and patient able to bend at knees Anesthetic complications: no     Last Vitals:  Vitals:   01/25/18 2335 01/26/18 0740  BP: 96/60 111/66  Pulse: 62 68  Resp: 15   Temp: 36.9 C 36.9 C  SpO2: 98% 96%    Last Pain:  Vitals:   01/25/18 2116  TempSrc:   PainSc: 0-No pain                 Mathews Argyle P

## 2018-01-26 NOTE — Discharge Summary (Signed)
Physician Discharge Summary  Patient ID: Samantha Avery MRN: [id number] DOB/AGE: March 25, 1965 53 y.o.  Admit date: 01/25/2018 Discharge date: 01/30/2018 Admission Diagnoses:  PRIMARY OSTEOARTHRITIS OF RIGHT KNEE   Discharge Diagnoses: Patient Active Problem List   Diagnosis Date Noted  . S/P TKR (total knee replacement) using cement, right 01/25/2018    Past Medical History:  Diagnosis Date  . Arthritis   . GERD (gastroesophageal reflux disease)   . Hypothyroidism   . Thyroid disease      Transfusion: none   Consultants (if any):   Discharged Condition: Improved  Hospital Course: Samantha Avery is an 53 y.o. female who was admitted 01/25/2018 with a diagnosis of right knee osteoarthritis and went to the operating room on 01/25/2018 and underwent the above named procedures.    Surgeries: Procedure(s): TOTAL KNEE ARTHROPLASTY on 01/25/2018 Patient tolerated the surgery well. Taken to PACU where she was stabilized and then transferred to the orthopedic floor.  Started on aspirin , TEDs, SCDs. Heels elevated on bed with rolled towels. No evidence of DVT. Negative Homan. Physical therapy started on day #1 for gait training and transfer. OT started day #1 for ADL and assisted devices.  Patient's foley was d/c on day #1. Patient's IV was d/c on day #2.  On post op day #3 patient found to have a PE. Patient started on Eliquis. VSS with 2 liters of 02.  On post op day 5 patient was stable, off O2 with normal VSS, patient stable and ready for discharge to home with HHPT.  Implants: Medacta GM K sphere system right 3+ femur 3 tibia, short stem with 11 mm insert and 2 patella, all components cemented  She was given perioperative antibiotics:  Anti-infectives (From admission, onward)   Start     Dose/Rate Route Frequency Ordered Stop   01/25/18 1030  ceFAZolin (ANCEF) IVPB 2g/100 mL premix     2 g 200 mL/hr over 30 Minutes Intravenous Every 6 hours 01/25/18 1027  01/25/18 2254   01/25/18 0550  ceFAZolin (ANCEF) 2-4 GM/100ML-% IVPB    Note to Pharmacy:  Hallaji, Violet   : cabinet override      01/25/18 0550 01/25/18 0737   01/24/18 2215  ceFAZolin (ANCEF) IVPB 2g/100 mL premix     2 g 200 mL/hr over 30 Minutes Intravenous  Once 01/24/18 2206 01/25/18 0807    .  She was given sequential compression devices, early ambulation, and Eliquis, TEDs.  She benefited maximally from the hospital stay and there were no complications.    Recent vital signs:  Vitals:   01/29/18 2332 01/30/18 0749  BP: 140/75 (!) 144/82  Pulse: 84 81  Resp: 19   Temp: 99.5 F (37.5 C) 98.1 F (36.7 C)  SpO2: 97% 93%    Recent laboratory studies:  Lab Results  Component Value Date   HGB 11.0 (L) 01/28/2018   HGB 11.0 (L) 01/26/2018   HGB 13.2 01/19/2018   Lab Results  Component Value Date   WBC 12.2 (H) 01/28/2018   PLT 257 01/28/2018   Lab Results  Component Value Date   INR 0.97 01/19/2018   Lab Results  Component Value Date   NA 135 01/28/2018   K 4.1 01/28/2018   CL 99 01/28/2018   CO2 28 01/28/2018   BUN 7 01/28/2018   CREATININE 0.50 01/28/2018   GLUCOSE 112 (H) 01/28/2018    Discharge Medications:   Allergies as of 01/30/2018   No Known Allergies  Medication List    TAKE these medications   apixaban 5 MG Tabs tablet Commonly known as:  ELIQUIS Take 2 tablets (10 mg total) by mouth 2 (two) times daily for 5 days. After 5 days take 5 mg BID   apixaban 5 MG Tabs tablet Commonly known as:  ELIQUIS Take 1 tablet (5 mg total) by mouth 2 (two) times daily.   bisacodyl 5 MG EC tablet Commonly known as:  DULCOLAX Take 1 tablet (5 mg total) by mouth daily as needed for moderate constipation.   levothyroxine 100 MCG tablet Commonly known as:  SYNTHROID, LEVOTHROID Take 100 mcg by mouth daily before breakfast.   norethindrone 5 MG tablet Commonly known as:  AYGESTIN Take 5 mg by mouth daily.   omeprazole 20 MG capsule Commonly  known as:  PRILOSEC Take 20 mg by mouth daily.   oxyCODONE 5 MG immediate release tablet Commonly known as:  Oxy IR/ROXICODONE Take 1-2 tablets (5-10 mg total) by mouth every 4 (four) hours as needed for moderate pain (pain score 4-6).            Durable Medical Equipment  (From admission, onward)         Start     Ordered   01/25/18 1028  DME 3 n 1  Once     01/25/18 1027   01/25/18 1028  DME Bedside commode  Once    Question:  Patient needs a bedside commode to treat with the following condition  Answer:  S/P TKR (total knee replacement) using cement, right   01/25/18 1027   01/25/18 1028  DME Walker rolling  Once    Question:  Patient needs a walker to treat with the following condition  Answer:  S/P TKR (total knee replacement) using cement, right   01/25/18 1027          Diagnostic Studies: Dg Knee 1-2 Views Right  Result Date: 01/25/2018 CLINICAL DATA:  Right knee replacement EXAM: RIGHT KNEE - 1-2 VIEW COMPARISON:  CT 12/06/2017 FINDINGS: Changes of right knee replacement. Soft tissue and joint space gas. No hardware bony complicating feature. IMPRESSION: Right knee replacement.  No visible complicating feature. Electronically Signed   By: Charlett Nose M.D.   On: 01/25/2018 09:57   Ct Angio Chest Pe W Or Wo Contrast  Addendum Date: 01/28/2018   ADDENDUM REPORT: 01/28/2018 08:43 ADDENDUM: Critical Value/emergent results were called by telephone at the time of interpretation on 01/28/2018 at 8:40 am to Dr. Rosita Kea, who verbally acknowledged these results. Electronically Signed   By: Lupita Raider, M.D.   On: 01/28/2018 08:43   Result Date: 01/28/2018 CLINICAL DATA:  Shortness of breath. EXAM: CT ANGIOGRAPHY CHEST WITH CONTRAST TECHNIQUE: Multidetector CT imaging of the chest was performed using the standard protocol during bolus administration of intravenous contrast. Multiplanar CT image reconstructions and MIPs were obtained to evaluate the vascular anatomy. CONTRAST:  53mL  OMNIPAQUE IOHEXOL 350 MG/ML SOLN COMPARISON:  Radiographs of November 09, 2017. FINDINGS: Cardiovascular: Filling defects are seen in lower lobe branches of both pulmonary arteries consistent with pulmonary emboli. Normal cardiac size. No pericardial effusion. No evidence of thoracic aortic aneurysm or dissection. RV/LV ratio of 1.1 is noted suggesting right heart strain. Mediastinum/Nodes: Small sliding-type hiatal hernia is noted. No adenopathy is noted. 2.7 cm left thyroid nodule is noted. Lungs/Pleura: Lungs are clear. No pleural effusion or pneumothorax. Upper Abdomen: Fatty infiltration of the liver is noted. Musculoskeletal: No chest wall abnormality. No acute or significant  osseous findings. Review of the MIP images confirms the above findings. IMPRESSION: Findings consistent with acute pulmonary emboli in lower lobe branches of bilateral pulmonary arteries. Positive for acute PE with CT evidence of right heart strain (RV/LV Ratio = 1.1) consistent with at least submassive (intermediate risk) PE. The presence of right heart strain has been associated with an increased risk of morbidity and mortality. 2.7 cm left thyroid nodule. Thyroid ultrasound is recommended for further evaluation. Small sliding-type hiatal hernia. Fatty infiltration of the liver. Electronically Signed: By: Lupita RaiderJames  Green Jr, M.D. On: 01/28/2018 08:27    Disposition:     Follow-up Information    Evon SlackGaines, Elliyah Liszewski C, PA-C Follow up in 2 week(s).   Specialties:  Orthopedic Surgery, Emergency Medicine Contact information: [address] [place] [address] [phone]            Signed: Patience MuscaGAINES, Jenie Parish CHRISTOPHER 01/30/2018, 8:22 AM

## 2018-01-26 NOTE — Progress Notes (Signed)
   Subjective: 1 Day Post-Op Procedure(s) (LRB): TOTAL KNEE ARTHROPLASTY (Right) Patient reports pain as mild.   Patient is well, and has had no acute complaints or problems Denies any CP, SOB, ABD pain. We will continue therapy today.  Plan is to go Home after hospital stay.  Objective: Vital signs in last 24 hours: Temp:  [97.7 F (36.5 C)-99 F (37.2 C)] 98.5 F (36.9 C) (01/10 0740) Pulse Rate:  [55-76] 68 (01/10 0740) Resp:  [15-26] 15 (01/09 2335) BP: (92-125)/(56-78) 111/66 (01/10 0740) SpO2:  [93 %-100 %] 96 % (01/10 0740)  Intake/Output from previous day: 01/09 0701 - 01/10 0700 In: 1915.3 [P.O.:530; I.V.:1225; IV Piggyback:160.3] Out: 2360 [Urine:2310; Blood:50] Intake/Output this shift: No intake/output data recorded.  Recent Labs    01/26/18 0330  HGB 11.0*   Recent Labs    01/26/18 0330  WBC 11.2*  RBC 3.94  HCT 35.2*  PLT 289   Recent Labs    01/26/18 0330  NA 136  K 3.7  CL 106  CO2 25  BUN 8  CREATININE 0.55  GLUCOSE 115*  CALCIUM 8.2*   No results for input(s): LABPT, INR in the last 72 hours.  EXAM General - Patient is Alert, Appropriate and Oriented Extremity - Neurovascular intact Sensation intact distally Intact pulses distally Dorsiflexion/Plantar flexion intact No cellulitis present Compartment soft Dressing - dressing C/D/I and no drainage, wound vac intact Motor Function - intact, moving foot and toes well on exam.   Past Medical History:  Diagnosis Date  . Arthritis   . GERD (gastroesophageal reflux disease)   . Hypothyroidism   . Thyroid disease     Assessment/Plan:   1 Day Post-Op Procedure(s) (LRB): TOTAL KNEE ARTHROPLASTY (Right) Active Problems:   S/P TKR (total knee replacement) using cement, right  Estimated body mass index is 46.84 kg/m as calculated from the following:   Height as of this encounter: 5' (1.524 m).   Weight as of this encounter: 108.8 kg. Advance diet Up with therapy  Needs BM Pain  well controlled VSS Labs WNL CM to assist with discharge to home with HHPT  DVT Prophylaxis - Aspirin, TED hose and SCDs Weight-Bearing as tolerated to right leg   T. Cranston Neighbor, PA-C [facility] 01/26/2018, 7:54 AM

## 2018-01-26 NOTE — Progress Notes (Signed)
Physical Therapy Treatment Patient Details Name: Samantha Avery MRN: [id number] DOB: 1965/02/08 Today's Date: 01/26/2018    History of Present Illness Pt is a 53 y/o F s/p R TKA.      PT Comments    Pt ambulated around nurses station with cues provided for improved gait mechanics and posture. Continued to emphasize the importance of using bone foam with increasing tolerance. Pt demonstrate proper hand placement and safe technique with sit<>stand transfers this session.  Follow up recommendations remain appropriate.    Follow Up Recommendations  Outpatient PT     Equipment Recommendations  Other (comment)(Bari RW, pt has regular RW at home)    Recommendations for Other Services       Precautions / Restrictions Precautions Precautions: Fall;Knee Precaution Booklet Issued: Yes (comment) Precaution Comments: Provided pt with Spanish exercise hanout Restrictions Weight Bearing Restrictions: Yes RLE Weight Bearing: Weight bearing as tolerated    Mobility  Bed Mobility Overal bed mobility: Needs Assistance Bed Mobility: Supine to Sit;Sit to Supine     Supine to sit: Min guard;HOB elevated Sit to supine: Min guard   General bed mobility comments: HOB slightly elevated and pt uses bed rail.    Transfers Overall transfer level: Needs assistance Equipment used: Rolling walker (2 wheeled) Transfers: Sit to/from Stand Sit to Stand: Min guard         General transfer comment: Pt demonstrated proper hand placement.  Min guard for safety.   Ambulation/Gait Ambulation/Gait assistance: Min guard Gait Distance (Feet): 160 Feet Assistive device: Rolling walker (2 wheeled) Gait Pattern/deviations: Step-to pattern;Decreased stance time - right;Decreased step length - left;Decreased weight shift to right;Antalgic Gait velocity: decreased   General Gait Details: Cues for improved R knee F at terminal stance.  Cues to relax UEs.    Stairs              Wheelchair Mobility    Modified Rankin (Stroke Patients Only)       Balance Overall balance assessment: Needs assistance Sitting-balance support: No upper extremity supported;Feet supported Sitting balance-Leahy Scale: Good     Standing balance support: Single extremity supported;During functional activity Standing balance-Leahy Scale: Poor Standing balance comment: Pt relies on at least 1UE support for static and dynamic activity                            Cognition Arousal/Alertness: Awake/alert Behavior During Therapy: WFL for tasks assessed/performed Overall Cognitive Status: Within Functional Limits for tasks assessed                                        Exercises Total Joint Exercises Ankle Circles/Pumps: AROM;Both;10 reps;Supine Quad Sets: Strengthening;Both;10 reps;Supine Hip ABduction/ADduction: Strengthening;Both;10 reps;Seated;Other (comment)(isometric adduction with pillow) Straight Leg Raises: Strengthening;Right;10 reps;Supine;AAROM Long [employer]: Strengthening;Right;10 reps;Seated Knee Flexion: AAROM;Right;5 reps;Seated(with 5 second holds) Goniometric ROM: -4 to 76 deg    General Comments General comments (skin integrity, edema, etc.): In person interpreter, Maryjane Hurter, utilized.  Continued to emphasize improtance of using bone foam.       Pertinent Vitals/Pain Pain Assessment: Faces Pain Score: 10-Worst pain ever Faces Pain Scale: Hurts whole lot Pain Location: R knee Pain Descriptors / Indicators: Grimacing;Guarding;Moaning Pain Intervention(s): Limited activity within patient's tolerance;Monitored during session;Repositioned;Utilized relaxation techniques;Ice applied    Home Living Family/patient expects to be discharged to:: Private residence Living Arrangements: Children;Other relatives Available  Help at Discharge: Family;Available 24 hours/day Type of Home: Apartment Home Access: Level entry   Home Layout: One  level Home Equipment: None      Prior Function Level of Independence: Independent      Comments: Indpendent with ADLs, and IADLs. Working.   PT Goals (current goals can now be found in the care plan section) Acute Rehab PT Goals Patient Stated Goal: to improve independence PT Goal Formulation: With patient Time For Goal Achievement: 02/08/18 Potential to Achieve Goals: Good Progress towards PT goals: Progressing toward goals    Frequency    BID      PT Plan Current plan remains appropriate    Co-evaluation              AM-PAC PT "6 Clicks" Mobility   Outcome Measure  Help needed turning from your back to your side while in a flat bed without using bedrails?: A Little Help needed moving from lying on your back to sitting on the side of a flat bed without using bedrails?: A Little Help needed moving to and from a bed to a chair (including a wheelchair)?: A Little Help needed standing up from a chair using your arms (e.g., wheelchair or bedside chair)?: A Little Help needed to walk in hospital room?: A Little Help needed climbing 3-5 steps with a railing? : A Little 6 Click Score: 18    End of Session Equipment Utilized During Treatment: Gait belt Activity Tolerance: Patient tolerated treatment well;Patient limited by pain Patient left: with call bell/phone within reach;with family/visitor present;Other (comment);with SCD's reapplied;in bed;with bed alarm set(polar care and bone foam) Nurse Communication: Mobility status PT Visit Diagnosis: Pain;Other abnormalities of gait and mobility (R26.89);Unsteadiness on feet (R26.81);Muscle weakness (generalized) (M62.81) Pain - Right/Left: Right Pain - part of body: Knee     Time: 1311-1345 PT Time Calculation (min) (ACUTE ONLY): 34 min  Charges:  $Gait Training: 8-22 mins $Therapeutic Exercise: 8-22 mins $Therapeutic Activity: 8-22 mins                     Encarnacion Chu PT, DPT 01/26/2018, 2:00 PM

## 2018-01-26 NOTE — Evaluation (Signed)
Occupational Therapy Evaluation Patient Details Name: Samantha Avery MRN: [id number] DOB: [birth year] Today's Date: 01/26/2018    History of Present Illness Pt is a 53 y/o Female who was admitted to [facility] for a Right TKA.     Clinical Impression   Pt. Presents with weakness, limited activity tolerance, and limited functional mobility which limits the ability to complete basic ADL and IADL functioning. Pt. Resides at home with her daughters,and grandchildren. Pt. Was independent with ADLs, and IADL functioning: including meal preparation, and medication management. Pt. 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 Pt. Education was provided about A/E use for LE ADLS, as well as toilet aides, and pt. Had difficulty prior to surgery with thorough toilet hygiene skills. Pt. Could benefit from OT services for ADL training, continued A/E training, and pt. education about home modification, and DME. Pt. Plans to return home upon discharge, with family assistance as needed. No follow-up OT services are warranted at this time.    Follow Up Recommendations  No follow-up OT services    Equipment Recommendations       Recommendations for Other Services       Precautions / Restrictions Restrictions Weight Bearing Restrictions: Yes RLE Weight Bearing: Weight bearing as tolerated      Mobility Bed Mobility                  Transfers       Sit to Stand: Min guard              Balance                                           ADL either performed or assessed with clinical judgement   ADL Overall ADL's : Needs assistance/impaired Eating/Feeding: Independent   Grooming: Independent   Upper Body Bathing: Minimal assistance   Lower Body Bathing: Maximal assistance   Upper Body Dressing : Minimal assistance   Lower Body Dressing: Maximal assistance       Toileting- Clothing Manipulation  and Hygiene: Maximal assistance         General ADL Comments: Pt. education was provided about A/E use for LE ADLs. Options for toilet aides.     Vision Baseline Vision/History: No visual deficits Patient Visual Report: No change from baseline       Perception     Praxis      Pertinent Vitals/Pain Pain Assessment: 0-10 Pain Score: 10-Worst pain ever Pain Location: Right knee Pain Descriptors / Indicators: Aching Pain Intervention(s): Limited activity within patient's tolerance;Monitored during session;Premedicated before session     Hand Dominance Right   Extremity/Trunk Assessment Upper Extremity Assessment Upper Extremity Assessment: Overall WFL for tasks assessed           Communication Communication Communication: (Spanish speaking, Interpreter services were used.)   Cognition Arousal/Alertness: Awake/alert Behavior During Therapy: WFL for tasks assessed/performed Overall Cognitive Status: Within Functional Limits for tasks assessed                                     General Comments       Exercises     Shoulder Instructions      Home Living Family/patient expects to be discharged to:: Private residence  Living Arrangements: Children;Other relatives Available Help at Discharge: Family;Available 24 hours/day Type of Home: Apartment Home Access: Level entry     Home Layout: One level     Bathroom Shower/Tub: Engineer, productionTub/shower unit     Bathroom Accessibility: Yes   Home Equipment: None          Prior Functioning/Environment Level of Independence: Independent        Comments: Indpendent with ADLs, and IADLs. Working.        OT Problem List: Decreased strength;Decreased knowledge of use of DME or AE;Decreased activity tolerance;Pain;Decreased range of motion      OT Treatment/Interventions: Self-care/ADL training;Therapeutic exercise;Patient/family education;DME and/or AE instruction;Therapeutic activities    OT  Goals(Current goals can be found in the care plan section) Acute Rehab OT Goals Patient Stated Goal: To improve indepndence OT Goal Formulation: With patient Potential to Achieve Goals: Good  OT Frequency: Min 2X/week   Barriers to D/C:            Co-evaluation              AM-PAC OT "6 Clicks" Daily Activity     Outcome Measure Help from another person eating meals?: None Help from another person taking care of personal grooming?: None Help from another person toileting, which includes using toliet, bedpan, or urinal?: A Lot Help from another person bathing (including washing, rinsing, drying)?: A Lot Help from another person to put on and taking off regular upper body clothing?: A Little Help from another person to put on and taking off regular lower body clothing?: A Lot 6 Click Score: 17   End of Session    Activity Tolerance: Patient tolerated treatment well Patient left: in bed  OT Visit Diagnosis: Muscle weakness (generalized) (M62.81)                Time: [id number] OT Time Calculation (min): 26 min Charges:  OT General Charges $OT Visit: 1 Visit OT Evaluation $OT Eval Low Complexity: 1 Low  Olegario MessierElaine Angee Gupton, MS, OTR/L   Olegario Messierlaine Keesha Pellum 01/26/2018, 11:47 AM

## 2018-01-26 NOTE — Discharge Instructions (Signed)

## 2018-01-26 NOTE — Progress Notes (Signed)
Physical Therapy Treatment Patient Details Name: Samantha Avery MRN: [id number] DOB: [birth year] Today's Date: 01/26/2018    History of Present Illness Pt is a 53 y/o F s/p R TKA.      PT Comments    Pt made good progress and ambulated around nurses station with RW.  Pt requires cues with transfers for safety.  Emphasized importance of using bone foam with gradually increasing tolerance.  Follow up recommendations remain appropriate.    Follow Up Recommendations  Outpatient PT     Equipment Recommendations  Other (comment)(Bari RW, pt has regular RW at home)    Recommendations for Other Services       Precautions / Restrictions Precautions Precautions: Fall;Knee Restrictions Weight Bearing Restrictions: Yes RLE Weight Bearing: Weight bearing as tolerated    Mobility  Bed Mobility               General bed mobility comments: Pt sitting in chair at start and end of session  Transfers Overall transfer level: Needs assistance Equipment used: Rolling walker (2 wheeled) Transfers: Sit to/from Stand Sit to Stand: Min guard         General transfer comment: Cues for proper hand placement with sit<>stand   Ambulation/Gait Ambulation/Gait assistance: Min guard Gait Distance (Feet): 160 Feet Assistive device: Rolling walker (2 wheeled) Gait Pattern/deviations: Step-to pattern;Decreased stance time - right;Decreased step length - left;Decreased weight shift to right;Antalgic Gait velocity: decreased   General Gait Details: Pt remains steady using RW.     Stairs             Wheelchair Mobility    Modified Rankin (Stroke Patients Only)       Balance Overall balance assessment: Needs assistance Sitting-balance support: No upper extremity supported;Feet supported Sitting balance-Leahy Scale: Good     Standing balance support: Single extremity supported;During functional activity Standing balance-Leahy Scale: Poor Standing balance comment:  Pt relies on at least 1UE support for static and dynamic activity                            Cognition Arousal/Alertness: Awake/alert Behavior During Therapy: WFL for tasks assessed/performed Overall Cognitive Status: Within Functional Limits for tasks assessed                                        Exercises Total Joint Exercises Ankle Circles/Pumps: AROM;Both;10 reps;Supine Quad Sets: Strengthening;Both;10 reps;Supine Straight Leg Raises: Strengthening;Right;10 reps;Supine Long Arc Quad: Strengthening;Right;10 reps;Seated Knee Flexion: AAROM;Right;5 reps;Seated(with 5 second holds) Goniometric ROM: -4 to 76 deg    General Comments General comments (skin integrity, edema, etc.): In person interpreter utilized.  Emphasized importance of using bone foam with gradually increasing tolerance.       Pertinent Vitals/Pain Pain Assessment: Faces Pain Score: 10-Worst pain ever Faces Pain Scale: Hurts whole lot Pain Location: R knee Pain Descriptors / Indicators: Grimacing;Guarding;Moaning Pain Intervention(s): Limited activity within patient's tolerance;Monitored during session;Utilized relaxation techniques;Ice applied;Repositioned    Home Living Family/patient expects to be discharged to:: Private residence Living Arrangements: Children;Other relatives Available Help at Discharge: Family;Available 24 hours/day Type of Home: Apartment Home Access: Level entry   Home Layout: One level Home Equipment: None      Prior Function Level of Independence: Independent      Comments: Indpendent with ADLs, and IADLs. Working.   PT Goals (current goals can now be  found in the care plan section) Acute Rehab PT Goals Patient Stated Goal: to improve independence PT Goal Formulation: With patient Time For Goal Achievement: 02/08/18 Potential to Achieve Goals: Good Progress towards PT goals: Progressing toward goals    Frequency    BID      PT Plan  Current plan remains appropriate    Co-evaluation              AM-PAC PT "6 Clicks" Mobility   Outcome Measure  Help needed turning from your back to your side while in a flat bed without using bedrails?: A Little Help needed moving from lying on your back to sitting on the side of a flat bed without using bedrails?: A Little Help needed moving to and from a bed to a chair (including a wheelchair)?: A Little Help needed standing up from a chair using your arms (e.g., wheelchair or bedside chair)?: A Little Help needed to walk in hospital room?: A Little Help needed climbing 3-5 steps with a railing? : A Little 6 Click Score: 18    End of Session Equipment Utilized During Treatment: Gait belt Activity Tolerance: Patient tolerated treatment well;Patient limited by pain Patient left: with call bell/phone within reach;with family/visitor present;Other (comment);in chair;with chair alarm set;with SCD's reapplied(polar care and bone foam) Nurse Communication: Mobility status PT Visit Diagnosis: Pain;Other abnormalities of gait and mobility (R26.89);Unsteadiness on feet (R26.81);Muscle weakness (generalized) (M62.81) Pain - Right/Left: Right Pain - part of body: Knee     Time: 0932-3557 PT Time Calculation (min) (ACUTE ONLY): 48 min  Charges:  $Gait Training: 8-22 mins $Therapeutic Exercise: 8-22 mins $Therapeutic Activity: 8-22 mins                     Encarnacion Chu PT, DPT 01/26/2018, 1:53 PM

## 2018-01-27 MED ORDER — BISACODYL 10 MG RE SUPP
10.0000 mg | Freq: Once | RECTAL | Status: AC
  Administered 2018-01-27: 10 mg via RECTAL
  Filled 2018-01-27: qty 1

## 2018-01-27 NOTE — Progress Notes (Signed)
Physical Therapy Treatment Patient Details Name: Samantha JaegerJuliana G Vasquez-Riverol MRN: [id number] DOB: Aug 15, 1965 Today's Date: 01/27/2018    History of Present Illness Pt is a 53 y/o F s/p R TKA.      PT Comments    Pt was seen for a walk, then for trip to restroom and finally to reposition herself on the bed.  Pt is requiring help to get RLE off the bed then back on.  Pt does not have an explanation for this, may be due to increased fatigue with gait or her discomfort, but was medicated and iced for pain.  Follow up with pt to increase her strength and ROM to R knee as she continues to increase standing balance control and independence in BR. However, pt asks PT to take care of getting dried after trip to BR, to help her with all small tasks.  Will encourage independence as is appropriate for home.   Follow Up Recommendations  Outpatient PT     Equipment Recommendations  Other (comment)(bari RW)    Recommendations for Other Services OT consult     Precautions / Restrictions Precautions Precautions: Fall;Knee Precaution Booklet Issued: Yes (comment) Precaution Comments: Provided pt with Spanish exercise hanout Restrictions Weight Bearing Restrictions: Yes RLE Weight Bearing: Weight bearing as tolerated    Mobility  Bed Mobility Overal bed mobility: Needs Assistance Bed Mobility: Supine to Sit;Sit to Supine     Supine to sit: Min assist Sit to supine: Min assist   General bed mobility comments: pt asking PT to lift her R leg onto the bed  Transfers Overall transfer level: Needs assistance Equipment used: Rolling walker (2 wheeled) Transfers: Sit to/from Stand Sit to Stand: Supervision         General transfer comment: supervised from bed, from commode  Ambulation/Gait Ambulation/Gait assistance: Min guard(for safety) Gait Distance (Feet): 300 Feet Assistive device: Rolling walker (2 wheeled) Gait Pattern/deviations: Step-to pattern;Step-through pattern;Decreased  stance time - right;Decreased weight shift to right;Wide base of support Gait velocity: decreased Gait velocity interpretation: <1.31 ft/sec, indicative of household ambulator     Stairs Stairs: (per pt lives in an apt with elevators)           Wheelchair Mobility    Modified Rankin (Stroke Patients Only)       Balance Overall balance assessment: Needs assistance Sitting-balance support: Feet supported Sitting balance-Leahy Scale: Good     Standing balance support: Bilateral upper extremity supported;During functional activity Standing balance-Leahy Scale: Fair Standing balance comment: requires UE support dynamically only                            Cognition Arousal/Alertness: Awake/alert Behavior During Therapy: WFL for tasks assessed/performed Overall Cognitive Status: Within Functional Limits for tasks assessed                                 General Comments: Daughter present to interpret since she is Spanish speaking and declined calling an interpretor.      Exercises      General Comments General comments (skin integrity, edema, etc.): has bandage on her knee, no signs of drainage, vac in place      Pertinent Vitals/Pain Pain Assessment: 0-10 Pain Score: 4  Pain Location: R knee Pain Descriptors / Indicators: Operative site guarding Pain Intervention(s): Limited activity within patient's tolerance;Monitored during session;Premedicated before session;Repositioned;Ice applied    Home Living  Prior Function            PT Goals (current goals can now be found in the care plan section) Acute Rehab PT Goals Patient Stated Goal: to improve independence Progress towards PT goals: Progressing toward goals    Frequency    BID      PT Plan Current plan remains appropriate    Co-evaluation              AM-PAC PT "6 Clicks" Mobility   Outcome Measure  Help needed turning from your  back to your side while in a flat bed without using bedrails?: None Help needed moving from lying on your back to sitting on the side of a flat bed without using bedrails?: A Little Help needed moving to and from a bed to a chair (including a wheelchair)?: A Little Help needed standing up from a chair using your arms (e.g., wheelchair or bedside chair)?: A Little Help needed to walk in hospital room?: A Little Help needed climbing 3-5 steps with a railing? : A Little 6 Click Score: 19    End of Session Equipment Utilized During Treatment: Gait belt Activity Tolerance: Patient tolerated treatment well;Patient limited by pain Patient left: with call bell/phone within reach;with family/visitor present;in bed;with bed alarm set Nurse Communication: Mobility status PT Visit Diagnosis: Pain;Other abnormalities of gait and mobility (R26.89);Unsteadiness on feet (R26.81);Muscle weakness (generalized) (M62.81) Pain - Right/Left: Right Pain - part of body: Knee     Time: 1345-1415 PT Time Calculation (min) (ACUTE ONLY): 30 min  Charges:  $Gait Training: 8-22 mins $Therapeutic Activity: 8-22 mins                    Ivar Drape 01/27/2018, 3:06 PM  Samul Dada, PT MS Acute Rehab Dept. Number: [facility] [id number] and [facility] [phone]

## 2018-01-27 NOTE — Progress Notes (Signed)
Occupational Therapy Treatment Patient Details Name: Samantha Avery MRN: [id number] DOB: 01-18-1965 Today's Date: 01/27/2018    History of present illness Pt is a 53 y/o F s/p R TKA.     OT comments  Pt. education was provided about A/E use for LE ADLs. Options for toilet aides discussed with handout Passenger transport manager).  Hands on training iwth use of reacher, sock aid and LH shoe horn but limited participation due to pain with jaw tremoring from increased pain when leaning forward in recliner to put RLE down on floor.  She is impulsive when transitioning from recliner sitting to standing due to pain but negotiated movement well to bed with CGA and cues. Rec continued OT and OT HH after discharge.  Follow Up Recommendations  Home health OT    Equipment Recommendations       Recommendations for Other Services      Precautions / Restrictions Precautions Precautions: Fall;Knee Restrictions Weight Bearing Restrictions: Yes RLE Weight Bearing: Weight bearing as tolerated       Mobility Bed Mobility Overal bed mobility: Needs Assistance Bed Mobility: Supine to Sit;Sit to Supine     Supine to sit: Min guard;HOB elevated Sit to supine: Min guard   General bed mobility comments: HOB slightly elevated and pt uses bed rail.    Transfers Overall transfer level: Needs assistance Equipment used: Rolling walker (2 wheeled) Transfers: Sit to/from Stand Sit to Stand: Supervision         General transfer comment: Pt demonstrated proper hand placement.  Min guard for safety.     Balance Overall balance assessment: Needs assistance Sitting-balance support: No upper extremity supported;Feet supported Sitting balance-Leahy Scale: Good     Standing balance support: Bilateral upper extremity supported Standing balance-Leahy Scale: Good                             ADL either performed or assessed with clinical judgement   ADL Overall ADL's : Needs  assistance/impaired Eating/Feeding: Independent   Grooming: Independent               Lower Body Dressing: Moderate assistance;Set up;With adaptive equipment Lower Body Dressing Details (indicate cue type and reason): sitting in recliner with pain 10/10 so hand over hand needed for follow through.  Daughter present to interpret.               General ADL Comments: Pt. education was provided about A/E use for LE ADLs. Options for toilet aides discussed with handout Passenger transport manager).  Hands on training iwth use of reacher, sock aid and LH shoe horn but limited participation due to pain with jaw tremoring from increased pain when leaning forward in recliner to put RLE down on floor.  She is impulsive when transitioning from recliner sitting to standing due to pain but negotiated movement well to bed with CGA and cues.       Vision Patient Visual Report: No change from baseline     Perception     Praxis      Cognition Arousal/Alertness: Awake/alert Behavior During Therapy: WFL for tasks assessed/performed Overall Cognitive Status: Within Functional Limits for tasks assessed                                 General Comments: Daughter present to interpret since she is Spanish speaking and declined calling an interpretor.  Exercises Total Joint Exercises Ankle Circles/Pumps: AROM;Both;10 reps;Supine Quad Sets: Strengthening;Both;10 reps;Supine Heel Slides: AAROM;Right;10 reps;Supine Hip ABduction/ADduction: AAROM;10 reps;Right;Supine Straight Leg Raises: Supine;AAROM;10 reps;Right Long Arc Quad: Strengthening;Right;10 reps;Seated Knee Flexion: AAROM;Right;5 reps;Seated Goniometric ROM: 3-80   Shoulder Instructions       General Comments      Pertinent Vitals/ Pain       Pain Assessment: Faces Pain Score: 10-Worst pain ever Faces Pain Scale: Hurts a little bit Pain Location: R knee Pain Descriptors / Indicators: Grimacing;Guarding Pain  Intervention(s): Limited activity within patient's tolerance;Repositioned;Monitored during session;Ice applied(pt declined Bone Foam until after receiving pain meds and NSG updated that she was back in bed and requesting pain meds.)  Home Living                                          Prior Functioning/Environment              Frequency  Min 2X/week        Progress Toward Goals  OT Goals(current goals can now be found in the care plan section)  Progress towards OT goals: Progressing toward goals  Acute Rehab OT Goals Patient Stated Goal: to improve independence OT Goal Formulation: With patient Potential to Achieve Goals: Good  Plan Discharge plan remains appropriate    Co-evaluation                 AM-PAC OT "6 Clicks" Daily Activity     Outcome Measure   Help from another person eating meals?: None Help from another person taking care of personal grooming?: None Help from another person toileting, which includes using toliet, bedpan, or urinal?: A Lot Help from another person bathing (including washing, rinsing, drying)?: A Lot Help from another person to put on and taking off regular upper body clothing?: A Little Help from another person to put on and taking off regular lower body clothing?: A Lot 6 Click Score: 17    End of Session Equipment Utilized During Treatment: Gait belt  OT Visit Diagnosis: Muscle weakness (generalized) (M62.81)   Activity Tolerance Patient limited by pain   Patient Left in bed;with family/visitor present   Nurse Communication Other (comment);Patient requests pain meds        Time: 1937-9024 OT Time Calculation (min): 28 min  Charges: OT General Charges $OT Visit: 1 Visit OT Treatments $Self Care/Home Management : 23-37 mins  Susanne Borders, OTR/L ascom [phone] 01/27/18, 12:07 PM

## 2018-01-27 NOTE — Progress Notes (Addendum)
Physical Therapy Treatment Patient Details Name: Samantha Avery MRN: [id number] DOB: 28-Mar-1965 Today's Date: 01/27/2018    History of Present Illness Pt is a 53 y/o F s/p R TKA.      PT Comments    Participated in exercises as described below.  To edge of bed with supervision.  Some inc in R knee ROM today 3-80, limited by pain.  She was able to stand and ambulate around unit x 1 with walker and min guard/supervision.  Pt reports no stairs at home, only very small threshold to step over.  Declined stair training at this time.  Pt on 2 lpm O2 upon arrival.  Sats mid 90's.  O2 was removed and pt fluctuated dipping to low to mid 80's at times but quickly increasing to low 90's with cues for deep breathing.  O2 was re-appled for gait at 1 lpm in hopes that would provide enough support.  Pt was able to ambulate 5680' with 1 lpm but when O2 was checked O2 87%, HR 125.  O2 was increased to 2 lpm and sats returned to mid 90's  with decrease in HR to low 100's.  Discussed with RN.  Pt left on 2 lpm after session.  Daughter in for session and was able to assist in interpreting for exercises and activity.   Follow Up Recommendations  Outpatient PT     Equipment Recommendations       Recommendations for Other Services       Precautions / Restrictions Precautions Precautions: Fall;Knee Restrictions Weight Bearing Restrictions: Yes RLE Weight Bearing: Weight bearing as tolerated    Mobility  Bed Mobility Overal bed mobility: Needs Assistance Bed Mobility: Supine to Sit;Sit to Supine     Supine to sit: Min guard;HOB elevated Sit to supine: Min guard   General bed mobility comments: HOB slightly elevated and pt uses bed rail.    Transfers Overall transfer level: Needs assistance Equipment used: Rolling walker (2 wheeled) Transfers: Sit to/from Stand Sit to Stand: Supervision         General transfer comment: Pt demonstrated proper hand placement.  Min guard for safety.    Ambulation/Gait Ambulation/Gait assistance: Supervision Gait Distance (Feet): 160 Feet Assistive device: Rolling walker (2 wheeled) Gait Pattern/deviations: Step-to pattern;Decreased stance time - right;Decreased step length - left;Decreased weight shift to right;Antalgic Gait velocity: decreased       Stairs             Wheelchair Mobility    Modified Rankin (Stroke Patients Only)       Balance Overall balance assessment: Needs assistance Sitting-balance support: No upper extremity supported;Feet supported Sitting balance-Leahy Scale: Good     Standing balance support: Bilateral upper extremity supported Standing balance-Leahy Scale: Good                              Cognition Arousal/Alertness: Awake/alert Behavior During Therapy: WFL for tasks assessed/performed Overall Cognitive Status: Within Functional Limits for tasks assessed                                        Exercises Total Joint Exercises Ankle Circles/Pumps: AROM;Both;10 reps;Supine Quad Sets: Strengthening;Both;10 reps;Supine Heel Slides: AAROM;Right;10 reps;Supine Hip ABduction/ADduction: AAROM;10 reps;Right;Supine Straight Leg Raises: Supine;AAROM;10 reps;Right Long Arc Quad: Strengthening;Right;10 reps;Seated Knee Flexion: AAROM;Right;5 reps;Seated Goniometric ROM: 3-80    General Comments  Pertinent Vitals/Pain Pain Assessment: Faces Faces Pain Scale: Hurts a little bit Pain Location: R knee - generally comfortable with rest, increased with stretching Pain Descriptors / Indicators: Grimacing;Guarding Pain Intervention(s): Limited activity within patient's tolerance;Monitored during session;Ice applied    Home Living                      Prior Function            PT Goals (current goals can now be found in the care plan section) Progress towards PT goals: Progressing toward goals    Frequency    BID      PT Plan Current  plan remains appropriate    Co-evaluation              AM-PAC PT "6 Clicks" Mobility   Outcome Measure  Help needed turning from your back to your side while in a flat bed without using bedrails?: A Little Help needed moving from lying on your back to sitting on the side of a flat bed without using bedrails?: A Little Help needed moving to and from a bed to a chair (including a wheelchair)?: A Little Help needed standing up from a chair using your arms (e.g., wheelchair or bedside chair)?: A Little Help needed to walk in hospital room?: A Little Help needed climbing 3-5 steps with a railing? : A Little 6 Click Score: 18    End of Session Equipment Utilized During Treatment: Gait belt Activity Tolerance: Patient tolerated treatment well;Patient limited by pain Patient left: in chair;with call bell/phone within reach;with family/visitor present;with chair alarm set Nurse Communication: Other (comment) Pain - Right/Left: Right Pain - part of body: Knee     Time: 1003-1035 PT Time Calculation (min) (ACUTE ONLY): 32 min  Charges:  $Gait Training: 8-22 mins $Therapeutic Exercise: 8-22 mins                     Danielle DessSarah Meaghann Choo, PTA 01/27/18, 11:09 AM

## 2018-01-27 NOTE — Progress Notes (Signed)
   Subjective: 2 Days Post-Op Procedure(s) (LRB): TOTAL KNEE ARTHROPLASTY (Right) Patient reports pain as mild and improving.   Patient is well, and has had no acute complaints or problems Denies any CP, SOB, ABD pain. We will continue therapy today.  Plan is to go Home after hospital stay.  Objective: Vital signs in last 24 hours: Temp:  [98 F (36.7 C)-99.4 F (37.4 C)] 98.3 F (36.8 C) (01/11 0004) Pulse Rate:  [64-91] 91 (01/11 0004) Resp:  [18] 18 (01/11 0004) BP: (101-123)/(54-71) 101/54 (01/11 0004) SpO2:  [87 %-96 %] 87 % (01/11 0004)  Intake/Output from previous day: 01/10 0701 - 01/11 0700 In: 120 [P.O.:120] Out: 0  Intake/Output this shift: No intake/output data recorded.  Recent Labs    01/26/18 0330  HGB 11.0*   Recent Labs    01/26/18 0330  WBC 11.2*  RBC 3.94  HCT 35.2*  PLT 289   Recent Labs    01/26/18 0330  NA 136  K 3.7  CL 106  CO2 25  BUN 8  CREATININE 0.55  GLUCOSE 115*  CALCIUM 8.2*   No results for input(s): LABPT, INR in the last 72 hours.  EXAM General - Patient is Alert, Appropriate and Oriented Extremity - Neurovascular intact Sensation intact distally Intact pulses distally Dorsiflexion/Plantar flexion intact No cellulitis present Compartment soft Dressing - dressing C/D/I and no drainage, wound vac intact. <50 cc drainage Motor Function - intact, moving foot and toes well on exam.   Past Medical History:  Diagnosis Date  . Arthritis   . GERD (gastroesophageal reflux disease)   . Hypothyroidism   . Thyroid disease     Assessment/Plan:   2 Days Post-Op Procedure(s) (LRB): TOTAL KNEE ARTHROPLASTY (Right) Active Problems:   S/P TKR (total knee replacement) using cement, right  Estimated body mass index is 46.84 kg/m as calculated from the following:   Height as of this encounter: 5' (1.524 m).   Weight as of this encounter: 108.8 kg. Advance diet Up with therapy  Needs BM Pain well controlled Low 02 sats  with NL HR and BP. Encourage incentive spirometer. Recheck vitals. Monitor vitals with PT today CM to assist with discharge to home with HHPT today pending NL VS and good progress with PT  DVT Prophylaxis - Aspirin, TED hose and SCDs Weight-Bearing as tolerated to right leg   T. Cranston Neighbor, PA-C [facility] 01/27/2018, 8:19 AM

## 2018-01-28 ENCOUNTER — Inpatient Hospital Stay: Payer: 59

## 2018-01-28 ENCOUNTER — Encounter: Payer: Self-pay | Admitting: Radiology

## 2018-01-28 LAB — CBC
HCT: 35.2 % — ABNORMAL LOW (ref 36.0–46.0)
Hemoglobin: 11 g/dL — ABNORMAL LOW (ref 12.0–15.0)
MCH: 28.1 pg (ref 26.0–34.0)
MCHC: 31.3 g/dL (ref 30.0–36.0)
MCV: 89.8 fL (ref 80.0–100.0)
Platelets: 257 10*3/uL (ref 150–400)
RBC: 3.92 MIL/uL (ref 3.87–5.11)
RDW: 14.1 % (ref 11.5–15.5)
WBC: 12.2 10*3/uL — ABNORMAL HIGH (ref 4.0–10.5)
nRBC: 0.2 % (ref 0.0–0.2)

## 2018-01-28 LAB — BASIC METABOLIC PANEL
Anion gap: 8 (ref 5–15)
BUN: 7 mg/dL (ref 6–20)
CO2: 28 mmol/L (ref 22–32)
Calcium: 8.2 mg/dL — ABNORMAL LOW (ref 8.9–10.3)
Chloride: 99 mmol/L (ref 98–111)
Creatinine, Ser: 0.5 mg/dL (ref 0.44–1.00)
GFR calc Af Amer: >60 mL/min (ref >60)
GFR calc non Af Amer: >60 mL/min (ref >60)
GLUCOSE: 112 mg/dL — AB (ref 70–99)
Potassium: 4.1 mmol/L (ref 3.5–5.1)
Sodium: 135 mmol/L (ref 135–145)

## 2018-01-28 MED ORDER — APIXABAN 5 MG PO TABS
10.0000 mg | ORAL_TABLET | Freq: Two times a day (BID) | ORAL | Status: DC
  Administered 2018-01-28 – 2018-01-30 (×5): 10 mg via ORAL
  Filled 2018-01-28 (×5): qty 2

## 2018-01-28 MED ORDER — IOHEXOL 350 MG/ML SOLN
75.0000 mL | Freq: Once | INTRAVENOUS | Status: AC | PRN
  Administered 2018-01-28: 75 mL via INTRAVENOUS

## 2018-01-28 NOTE — Progress Notes (Signed)
PT Cancellation Note  Patient Details Name: Samantha JaegerJuliana G Avery MRN: [id number] DOB: [birth year]   Cancelled Treatment:    Reason Eval/Treat Not Completed: Medical issues which prohibited therapy(Chart reviewed for planned treatment session. Noted order for chest CT to rule out PE.  Will hold therapy until results received and patient cleared for activity.)   Daryn Hicks H. Manson PasseyBrown, PT, DPT, NCS 01/28/18, 8:30 AM [phone]

## 2018-01-28 NOTE — Progress Notes (Signed)
Patient has bilateral PE, Eliquis 10 mg bid ordered

## 2018-01-28 NOTE — Progress Notes (Signed)
   Subjective: 3 Days Post-Op Procedure(s) (LRB): TOTAL KNEE ARTHROPLASTY (Right) Patient reports pain as mild and improving.  Low O2 sats with slightly elevated heart rate.  No chest pain or shortness of breath.  No calf pain. Patient is well, and has had no acute complaints or problems Denies any CP, SOB, ABD pain. Plan is to go Home after hospital stay.  Objective: Vital signs in last 24 hours: Temp:  [98 F (36.7 C)-100.7 F (38.2 C)] 99.8 F (37.7 C) (01/11 2351) Pulse Rate:  [82-101] 101 (01/11 2351) Resp:  [18] 18 (01/11 2351) BP: (102-120)/(51-57) 102/51 (01/11 2351) SpO2:  [81 %-100 %] 89 % (01/11 2351)  Intake/Output from previous day: 01/11 0701 - 01/12 0700 In: 250 [P.O.:250] Out: -  Intake/Output this shift: Total I/O In: 250 [P.O.:250] Out: -   Recent Labs    01/26/18 0330  HGB 11.0*   Recent Labs    01/26/18 0330  WBC 11.2*  RBC 3.94  HCT 35.2*  PLT 289   Recent Labs    01/26/18 0330  NA 136  K 3.7  CL 106  CO2 25  BUN 8  CREATININE 0.55  GLUCOSE 115*  CALCIUM 8.2*   No results for input(s): LABPT, INR in the last 72 hours.  EXAM General - Patient is Alert, Appropriate and Oriented Extremity - Neurovascular intact Sensation intact distally Intact pulses distally Dorsiflexion/Plantar flexion intact No cellulitis present Compartment soft Dressing - dressing C/D/I and no drainage, wound vac intact. <50 cc drainage Motor Function - intact, moving foot and toes well on exam.   Past Medical History:  Diagnosis Date  . Arthritis   . GERD (gastroesophageal reflux disease)   . Hypothyroidism   . Thyroid disease     Assessment/Plan:   3 Days Post-Op Procedure(s) (LRB): TOTAL KNEE ARTHROPLASTY (Right) Active Problems:   S/P TKR (total knee replacement) using cement, right  Estimated body mass index is 46.84 kg/m as calculated from the following:   Height as of this encounter: 5' (1.524 m).   Weight as of this encounter: 108.8  kg. Advance diet Up with therapy  Pain well controlled Patient with continued low O2 sats, slightly tachycardic.  No chest pain or shortness of breath.  Concern for possible PE.  Will order CT chest to rule out PE. CM to assist with discharge to home with outpatient PT.  DVT Prophylaxis - Aspirin, TED hose and SCDs Weight-Bearing as tolerated to right leg   T. Cranston Neighbor, PA-C [facility] 01/28/2018, 6:56 AM

## 2018-01-28 NOTE — Progress Notes (Signed)
PT Cancellation Note  Patient Details Name: Samantha Avery MRN: [id number] DOB: February 17, 1965   Cancelled Treatment:    Reason Eval/Treat Not Completed: Medical issues which prohibited therapy(CT chest significant for bilat acute PE.  Per guidelines, to be on anticoag x48 hours prior to continuation of exertional activity (unless cleared otherwise by physician).  Will follow and continue therapy services as medically appropriate.)  Leilany Digeronimo H. Manson Passey, PT, DPT, NCS 01/28/18, 9:32 AM [phone]

## 2018-01-29 MED ORDER — APIXABAN 5 MG PO TABS: 5.0000 mg | ORAL_TABLET | Freq: Two times a day (BID) | ORAL | 2 refills | Status: AC

## 2018-01-29 MED ORDER — APIXABAN 5 MG PO TABS: 10.0000 mg | ORAL_TABLET | Freq: Two times a day (BID) | ORAL | 0 refills | Status: DC

## 2018-01-29 NOTE — Progress Notes (Signed)
   Subjective: 4 Days Post-Op Procedure(s) (LRB): TOTAL KNEE ARTHROPLASTY (Right) Patient reports pain as mild and improving.  Diagnosed with PE, started on Eliquis and 2 L O2.  No chest pain or shortness of breath.  Patient is well, and has had no acute complaints or problems Denies ABD pain. Plan is to go Home after hospital stay.  Objective: Vital signs in last 24 hours: Temp:  [98.5 F (36.9 C)-99.4 F (37.4 C)] 99.3 F (37.4 C) (01/12 2226) Pulse Rate:  [82-98] 82 (01/12 2226) Resp:  [18] 18 (01/12 2226) BP: (117-128)/(60-68) 128/68 (01/12 2226) SpO2:  [96 %] 96 % (01/12 2226)  Intake/Output from previous day: No intake/output data recorded. Intake/Output this shift: No intake/output data recorded.  Recent Labs    01/28/18 0732  HGB 11.0*   Recent Labs    01/28/18 0732  WBC 12.2*  RBC 3.92  HCT 35.2*  PLT 257   Recent Labs    01/28/18 0732  NA 135  K 4.1  CL 99  CO2 28  BUN 7  CREATININE 0.50  GLUCOSE 112*  CALCIUM 8.2*   No results for input(s): LABPT, INR in the last 72 hours.  EXAM General - Patient is Alert, Appropriate and Oriented Extremity - Neurovascular intact Sensation intact distally Intact pulses distally Dorsiflexion/Plantar flexion intact No cellulitis present Compartment soft Dressing - dressing C/D/I and no drainage, wound vac intact. <50 cc drainage Motor Function - intact, moving foot and toes well on exam.   Past Medical History:  Diagnosis Date  . Arthritis   . GERD (gastroesophageal reflux disease)   . Hypothyroidism   . Thyroid disease     Assessment/Plan:   4 Days Post-Op Procedure(s) (LRB): TOTAL KNEE ARTHROPLASTY (Right) Active Problems:   S/P TKR (total knee replacement) using cement, right   PE  Estimated body mass index is 46.84 kg/m as calculated from the following:   Height as of this encounter: 5' (1.524 m).   Weight as of this encounter: 108.8 kg. Advance diet  Hold PT today, restart PT tomorrow   Pain well controlled Pulmonary Emboli - Continue with Eliquis. Currently on 2 Liters of 02. Wean off Oxygen. Monitor VS.  CM to assist with discharge to home with outpatient PT. Plan on discharge to home tomorrow  DVT Prophylaxis - Aspirin, TED hose and SCDs Weight-Bearing as tolerated to right leg   T. Cranston Neighbor, PA-C [facility] 01/29/2018, 8:00 AM

## 2018-01-29 NOTE — Progress Notes (Signed)
OT Cancellation Note  Patient Details Name: Samantha Avery MRN: [id number] DOB: Sep 28, 1965   Cancelled Treatment:    Reason Eval/Treat Not Completed: Medical issues which prohibited therapy. CT chest significant for bilat acute PE.  Per guidelines, to be on anticoag x48 hours prior to continuation of exertional activity (unless cleared otherwise by physician).  Will follow and continue therapy services as medically appropriate for OT treatment.   Richrd Prime, MPH, MS, OTR/L ascom [phone] 01/29/18, 8:28 AM

## 2018-01-29 NOTE — Care Management (Signed)
Notified Rose[facility] Bath with [facility] that the plan is to DC home with [facility] tomorrow.  Bedside commode in the room

## 2018-01-29 NOTE — Progress Notes (Signed)
PT Cancellation Note  Patient Details Name: Samantha Avery MRN: [id number] DOB: 1965/06/17   Cancelled Treatment:    Reason Eval/Treat Not Completed: Medical issues which prohibited therapy(Per physician progress, hold PT today; restart tomorrow once anticoagulation fully on board.  Will continue services next date as medically appropriate.)   Drena Ham H. Manson Passey, PT, DPT, NCS 01/29/18, 10:44 AM [phone]

## 2018-01-30 NOTE — Progress Notes (Signed)
   Subjective: 5 Days Post-Op Procedure(s) (LRB): TOTAL KNEE ARTHROPLASTY (Right) Patient reports pain as mild and improving.  Diagnosed with PE, started on Eliquis. Off O2 via .  No chest pain or shortness of breath.  Patient is well, and has had no acute complaints or problems Denies ABD pain. Plan is to go Home after hospital stay.  Objective: Vital signs in last 24 hours: Temp:  [98.1 F (36.7 C)-99.5 F (37.5 C)] 98.1 F (36.7 C) (01/14 0749) Pulse Rate:  [81-84] 81 (01/14 0749) Resp:  [19] 19 (01/13 2332) BP: (140-144)/(75-82) 144/82 (01/14 0749) SpO2:  [93 %-97 %] 93 % (01/14 0749)  Intake/Output from previous day: No intake/output data recorded. Intake/Output this shift: No intake/output data recorded.  Recent Labs    01/28/18 0732  HGB 11.0*   Recent Labs    01/28/18 0732  WBC 12.2*  RBC 3.92  HCT 35.2*  PLT 257   Recent Labs    01/28/18 0732  NA 135  K 4.1  CL 99  CO2 28  BUN 7  CREATININE 0.50  GLUCOSE 112*  CALCIUM 8.2*   No results for input(s): LABPT, INR in the last 72 hours.  EXAM General - Patient is Alert, Appropriate and Oriented Extremity - Neurovascular intact Sensation intact distally Intact pulses distally Dorsiflexion/Plantar flexion intact No cellulitis present Compartment soft Dressing - dressing C/D/I and no drainage, wound vac intact. <50 cc drainage Motor Function - intact, moving foot and toes well on exam.   Past Medical History:  Diagnosis Date  . Arthritis   . GERD (gastroesophageal reflux disease)   . Hypothyroidism   . Thyroid disease     Assessment/Plan:   5 Days Post-Op Procedure(s) (LRB): TOTAL KNEE ARTHROPLASTY (Right) Active Problems:   S/P TKR (total knee replacement) using cement, right   PE  Estimated body mass index is 46.84 kg/m as calculated from the following:   Height as of this encounter: 5' (1.524 m).   Weight as of this encounter: 108.8 kg. Advance diet  PT today. WBAT RLE Pain  well controlled Pulmonary Emboli - Continue with Eliquis. Off oxygen. VSS. CM to assist with discharge to home with outpatient PT. Plan on discharge to home today if stable vital signs, good progress with PT  DVT Prophylaxis - TED hose and Eliquis, SCDs Weight-Bearing as tolerated to right leg   T. Cranston Neighbor, PA-C [facility] 01/30/2018, 8:17 AM

## 2018-01-30 NOTE — Care Management (Signed)
Notified Samantha Avery with [facility], Patient is Discharging today, DME in the room

## 2018-01-30 NOTE — Progress Notes (Signed)
Physical Therapy Treatment Patient Details Name: Samantha Avery MRN: [id number] DOB: 05-15-1965 Today's Date: 01/30/2018    History of Present Illness Pt is a 53 y/o F s/p R TKA.  PMH includes hypothyroidism and Htn.    PT Comments    Pt has completed anticoagulation therapy beyond 48 hr window to hold therapy.  She was able to perform bed mobility with min A to initiate mobility of R LE.  Pt able to stand from bedside and perform toileting without assistance to rise.  She completed there ex with manual and verbal assistance and PT issued Spanish HEP at pt's request.  Pt reported 10/10 pain in R knee which affected ability to perform some there ex.  PT educated pt regarding pain science and importance of frequent mobilization and pt was agreeable.  Pt did desat to 89-90% range during seated exercises.  A Spanish interpreter was used during this treatment.  Pt will continue to benefit from skilled PT with focus on strength, pain management, safe functional mobility and knee flexion/extension ROM.   Discharge recommendation has been updated to home health due to medical complications which have occurred during hospital stay.  Follow Up Recommendations  Home health PT;Supervision for mobility/OOB     Equipment Recommendations  Other (comment)(bari RW)    Recommendations for Other Services       Precautions / Restrictions Restrictions Weight Bearing Restrictions: Yes RLE Weight Bearing: Weight bearing as tolerated    Mobility  Bed Mobility Overal bed mobility: Needs Assistance Bed Mobility: Supine to Sit;Sit to Supine     Supine to sit: Min assist     General bed mobility comments: pt asking PT to lift her R leg onto the bed  Transfers Overall transfer level: Needs assistance Equipment used: Rolling walker (2 wheeled) Transfers: Sit to/from Stand Sit to Stand: Supervision         General transfer comment: Able to rise from bedside and toilet without physical  assist.  Ambulation/Gait Ambulation/Gait assistance: Min guard(for safety) Gait Distance (Feet): 30 Feet Assistive device: Rolling walker (2 wheeled) Gait Pattern/deviations: Step-to pattern;Decreased stance time - right;Decreased weight shift to right;Wide base of support Gait velocity: decreased Gait velocity interpretation: <1.8 ft/sec, indicate of risk for recurrent falls General Gait Details: Pt able to navigate room with RW, fair foot clearance.   Stairs             Wheelchair Mobility    Modified Rankin (Stroke Patients Only)       Balance Overall balance assessment: Needs assistance Sitting-balance support: Feet supported Sitting balance-Leahy Scale: Good     Standing balance support: Bilateral upper extremity supported;During functional activity Standing balance-Leahy Scale: Fair Standing balance comment: requires UE support dynamically only                            Cognition Arousal/Alertness: Awake/alert Behavior During Therapy: WFL for tasks assessed/performed Overall Cognitive Status: Within Functional Limits for tasks assessed                                        Exercises Total Joint Exercises Ankle Circles/Pumps: AROM;Both;10 reps;Supine Quad Sets: Strengthening;Both;10 reps;Supine Heel Slides: AAROM;Right;10 reps;Supine Hip ABduction/ADduction: 10 reps;Right;Supine;Strengthening Straight Leg Raises: Supine;AAROM;10 reps;Right Knee Flexion: AAROM;Right;5 reps;Seated Goniometric ROM: r knee ext/flex: 3-87 degrees Other Exercises Other Exercises: Time taken to assist pt with toileting  x7 min.    General Comments        Pertinent Vitals/Pain Pain Score: 10-Worst pain ever Pain Intervention(s): Limited activity within patient's tolerance    Home Living                      Prior Function            PT Goals (current goals can now be found in the care plan section) Acute Rehab PT Goals Patient  Stated Goal: to improve independence Progress towards PT goals: Progressing toward goals    Frequency    BID      PT Plan Current plan remains appropriate    Co-evaluation              AM-PAC PT "6 Clicks" Mobility   Outcome Measure  Help needed turning from your back to your side while in a flat bed without using bedrails?: None Help needed moving from lying on your back to sitting on the side of a flat bed without using bedrails?: A Little Help needed moving to and from a bed to a chair (including a wheelchair)?: A Little Help needed standing up from a chair using your arms (e.g., wheelchair or bedside chair)?: A Little Help needed to walk in hospital room?: A Little Help needed climbing 3-5 steps with a railing? : A Little 6 Click Score: 19    End of Session Equipment Utilized During Treatment: Gait belt Activity Tolerance: Patient tolerated treatment well;Patient limited by pain Patient left: with call bell/phone within reach;with family/visitor present;in chair;with chair alarm set Nurse Communication: Mobility status PT Visit Diagnosis: Pain;Other abnormalities of gait and mobility (R26.89);Unsteadiness on feet (R26.81);Muscle weakness (generalized) (M62.81) Pain - Right/Left: Right Pain - part of body: Knee     Time: 8811-0315 PT Time Calculation (min) (ACUTE ONLY): 21 min  Charges:  $Therapeutic Exercise: 8-22 mins $Therapeutic Activity: 8-22 mins                     Glenetta Hew, PT, DPT    Glenetta Hew 01/30/2018, 11:27 AM

## 2018-01-30 NOTE — Progress Notes (Signed)
Pt ambulated to bathroom with 1 assist. Pt's O2 sats stayed 96 while ambulating on room air.

## 2018-01-30 NOTE — Progress Notes (Signed)
Discharge instructions and prescriptions given to pt. IV removed. Wound vac changed to Prevena. Will assist pt to get dressed and she will discharge home with daughter.

## 2018-01-30 NOTE — Progress Notes (Signed)
Physical Therapy Treatment Patient Details Name: NALLA TAULBEE MRN: [id number] DOB: [birth year] Today's Date: 01/30/2018    History of Present Illness Pt is a 53 y/o F s/p R TKA.  PMH includes hypothyroidism and Htn.    PT Comments    Pt able to ambulate 150 ft this afternoon with good use of RW and O2 sats remaining in low 90% range.  Pt did require several rest breaks and still presented with an antalgic gait.  She reported 6/10 pain but was able to complete there ex and demonstrate improved bed mobility.  PT allowed time for questions regarding HEP, home health PT and polar care.  Pt demonstrated good knee flexion with ambulation.  She will continue to benefit from skilled PT with focus on strength, functional mobility and pain management.   Follow Up Recommendations  Home health PT;Supervision for mobility/OOB     Equipment Recommendations  Other (comment)(bari RW)    Recommendations for Other Services       Precautions / Restrictions Precautions Precautions: Knee Precaution Booklet Issued: Yes (comment) Restrictions Weight Bearing Restrictions: Yes RLE Weight Bearing: Weight bearing as tolerated    Mobility  Bed Mobility Overal bed mobility: Needs Assistance Bed Mobility: Supine to Sit;Sit to Supine     Supine to sit: Min guard Sit to supine: Min guard   General bed mobility comments: Pt better able to perform bed mobility;  more confident with PT placing hand under R LE but did not require physical assistance for mobility.  Transfers Overall transfer level: Needs assistance Equipment used: Rolling walker (2 wheeled) Transfers: Sit to/from Stand Sit to Stand: Supervision         General transfer comment: Able to rise from bedside without assistance and with good use of RW.  Ambulation/Gait Ambulation/Gait assistance: Min guard(for safety) Gait Distance (Feet): 150 Feet Assistive device: Rolling walker (2 wheeled) Gait Pattern/deviations: Step-to  pattern;Decreased stance time - right;Decreased weight shift to right;Wide base of support   Gait velocity interpretation: <1.8 ft/sec, indicate of risk for recurrent falls General Gait Details: fair foot clearance and frequent breaks.  Pt did desat to 90% but recovered with short rest break.   Stairs             Wheelchair Mobility    Modified Rankin (Stroke Patients Only)       Balance Overall balance assessment: Needs assistance Sitting-balance support: Feet supported Sitting balance-Leahy Scale: Good     Standing balance support: Bilateral upper extremity supported;During functional activity Standing balance-Leahy Scale: Fair Standing balance comment: requires UE support dynamically only                            Cognition Arousal/Alertness: Awake/alert Behavior During Therapy: WFL for tasks assessed/performed Overall Cognitive Status: Within Functional Limits for tasks assessed                                        Exercises Total Joint Exercises Ankle Circles/Pumps: AROM;Both;10 reps;Supine Quad Sets: Strengthening;Both;10 reps;Supine Heel Slides: AAROM;Right;10 reps;Supine Hip ABduction/ADduction: 10 reps;Right;Supine;Strengthening Straight Leg Raises: Supine;AAROM;10 reps;Right Knee Flexion: AAROM;Right;5 reps;Seated Other Exercises Other Exercises: Review of HEP and use of polar care x5 min    General Comments General comments (skin integrity, edema, etc.): Spanish interpreter Delos Haring present throughout treatment.      Pertinent Vitals/Pain Pain Assessment: 0-10 Pain Score:  6  Pain Location: R knee Pain Intervention(s): Monitored during session    Home Living                      Prior Function            PT Goals (current goals can now be found in the care plan section) Acute Rehab PT Goals Patient Stated Goal: to improve independence Progress towards PT goals: Progressing toward goals     Frequency    BID      PT Plan Discharge plan needs to be updated    Co-evaluation              AM-PAC PT "6 Clicks" Mobility   Outcome Measure  Help needed turning from your back to your side while in a flat bed without using bedrails?: None Help needed moving from lying on your back to sitting on the side of a flat bed without using bedrails?: A Little Help needed moving to and from a bed to a chair (including a wheelchair)?: A Little Help needed standing up from a chair using your arms (e.g., wheelchair or bedside chair)?: A Little Help needed to walk in hospital room?: A Little Help needed climbing 3-5 steps with a railing? : A Little 6 Click Score: 19    End of Session Equipment Utilized During Treatment: Gait belt Activity Tolerance: Patient tolerated treatment well Patient left: with call bell/phone within reach;with family/visitor present;in bed;with bed alarm set   PT Visit Diagnosis: Pain;Other abnormalities of gait and mobility (R26.89);Unsteadiness on feet (R26.81);Muscle weakness (generalized) (M62.81) Pain - Right/Left: Right Pain - part of body: Knee     Time: 8280-0349 PT Time Calculation (min) (ACUTE ONLY): 24 min  Charges:  $Therapeutic Exercise: 23-37 mins                     Glenetta Hew, PT, DPT   Glenetta Hew 01/30/2018, 4:33 PM

## 2018-01-30 NOTE — Plan of Care (Signed)

## 2018-03-12 ENCOUNTER — Inpatient Hospital Stay: Admission: RE | Admit: 2018-03-12 | Payer: 59 | Source: Ambulatory Visit

## 2018-03-13 ENCOUNTER — Encounter
Admission: RE | Disposition: A | Discharge status: Home / Self Care | Payer: Self-pay | Source: Home / Self Care | Attending: Orthopedic Surgery

## 2018-03-13 ENCOUNTER — Ambulatory Visit
Admission: RE | Admit: 2018-03-13 | Discharge: 2018-03-13 | Disposition: A | Discharge status: Home / Self Care | Payer: 59 | Attending: Orthopedic Surgery | Admitting: Orthopedic Surgery

## 2018-03-13 ENCOUNTER — Encounter: Payer: Self-pay | Admitting: *Deleted

## 2018-03-13 ENCOUNTER — Ambulatory Visit: Payer: 59 | Admitting: Anesthesiology

## 2018-03-13 ENCOUNTER — Other Ambulatory Visit: Payer: Self-pay

## 2018-03-13 DIAGNOSIS — Z91040 Latex allergy status: Secondary | ICD-10-CM | POA: Diagnosis not present

## 2018-03-13 DIAGNOSIS — Z96651 Presence of right artificial knee joint: Secondary | ICD-10-CM | POA: Diagnosis not present

## 2018-03-13 DIAGNOSIS — M24661 Ankylosis, right knee: Secondary | ICD-10-CM | POA: Diagnosis not present

## 2018-03-13 DIAGNOSIS — K219 Gastro-esophageal reflux disease without esophagitis: Secondary | ICD-10-CM | POA: Insufficient documentation

## 2018-03-13 DIAGNOSIS — Z79890 Hormone replacement therapy (postmenopausal): Secondary | ICD-10-CM | POA: Insufficient documentation

## 2018-03-13 DIAGNOSIS — Z79899 Other long term (current) drug therapy: Secondary | ICD-10-CM | POA: Diagnosis not present

## 2018-03-13 DIAGNOSIS — Z7901 Long term (current) use of anticoagulants: Secondary | ICD-10-CM | POA: Diagnosis not present

## 2018-03-13 DIAGNOSIS — Z6841 Body Mass Index (BMI) 40.0 and over, adult: Secondary | ICD-10-CM | POA: Insufficient documentation

## 2018-03-13 HISTORY — PX: KNEE CLOSED REDUCTION: SHX995

## 2018-03-13 LAB — POCT PREGNANCY, URINE: Preg Test, Ur: NEGATIVE

## 2018-03-13 SURGERY — MANIPULATION, KNEE, CLOSED
Anesthesia: General | Laterality: Right

## 2018-03-13 MED ORDER — FENTANYL CITRATE (PF) 100 MCG/2ML IJ SOLN
25.0000 ug | INTRAMUSCULAR | Status: AC | PRN
  Administered 2018-03-13 (×6): 25 ug via INTRAVENOUS

## 2018-03-13 MED ORDER — OXYCODONE HCL 5 MG PO TABS
5.0000 mg | ORAL_TABLET | Freq: Once | ORAL | Status: AC
  Administered 2018-03-13: 5 mg via ORAL

## 2018-03-13 MED ORDER — FENTANYL CITRATE (PF) 100 MCG/2ML IJ SOLN
INTRAMUSCULAR | Status: AC
  Filled 2018-03-13: qty 2

## 2018-03-13 MED ORDER — OXYCODONE HCL 5 MG PO TABS
ORAL_TABLET | ORAL | Status: AC
  Filled 2018-03-13: qty 1

## 2018-03-13 MED ORDER — FENTANYL CITRATE (PF) 100 MCG/2ML IJ SOLN
INTRAMUSCULAR | Status: AC
  Administered 2018-03-13: 25 ug via INTRAVENOUS
  Filled 2018-03-13: qty 2

## 2018-03-13 MED ORDER — LACTATED RINGERS IV SOLN
INTRAVENOUS | Status: DC
  Administered 2018-03-13: 14:00:00 via INTRAVENOUS

## 2018-03-13 MED ORDER — LIDOCAINE HCL (CARDIAC) PF 100 MG/5ML IV SOSY
PREFILLED_SYRINGE | INTRAVENOUS | Status: DC | PRN
  Administered 2018-03-13: 100 mg via INTRAVENOUS

## 2018-03-13 MED ORDER — MIDAZOLAM HCL 2 MG/2ML IJ SOLN
INTRAMUSCULAR | Status: AC
  Filled 2018-03-13: qty 2

## 2018-03-13 MED ORDER — ONDANSETRON HCL 4 MG/2ML IJ SOLN
4.0000 mg | Freq: Once | INTRAMUSCULAR | Status: AC | PRN
  Administered 2018-03-13: 4 mg via INTRAVENOUS

## 2018-03-13 MED ORDER — PROPOFOL 10 MG/ML IV BOLUS
INTRAVENOUS | Status: DC | PRN
  Administered 2018-03-13: 50 mg via INTRAVENOUS
  Administered 2018-03-13: 100 mg via INTRAVENOUS

## 2018-03-13 MED ORDER — ONDANSETRON HCL 4 MG/2ML IJ SOLN
INTRAMUSCULAR | Status: AC
  Filled 2018-03-13: qty 2

## 2018-03-13 MED ORDER — FENTANYL CITRATE (PF) 100 MCG/2ML IJ SOLN
25.0000 ug | INTRAMUSCULAR | Status: AC | PRN
  Administered 2018-03-13 (×2): 25 ug via INTRAVENOUS

## 2018-03-13 MED ORDER — MIDAZOLAM HCL 2 MG/2ML IJ SOLN
INTRAMUSCULAR | Status: DC | PRN
  Administered 2018-03-13: 2 mg via INTRAVENOUS

## 2018-03-13 MED ORDER — OXYCODONE HCL 5 MG PO TABS: 5.0000 mg | ORAL_TABLET | ORAL | 0 refills | Status: AC | PRN

## 2018-03-13 SURGICAL SUPPLY — 2 items
COVER WAND RF STERILE (DRAPES) ×3 IMPLANT
KIT TURNOVER KIT A (KITS) ×3 IMPLANT

## 2018-03-13 NOTE — Op Note (Signed)
03/13/2018  2:46 PM  PATIENT:  Samantha Avery  53 y.o. female  PRE-OPERATIVE DIAGNOSIS:  STATUS POST RIGHT TOTAL KNEE REPLACEMENT, arthrofibrosis  POST-OPERATIVE DIAGNOSIS:  STATUS POST RIGHT TOTAL KNEE REPLACEMENT, arthrofibrosis  PROCEDURE:  Procedure(s): CLOSED MANIPULATION KNEE-RIGHT INTERPRETER APPOINTMENT (Right)  SURGEON: Leitha Schuller, MD  ASSISTANTS: None  ANESTHESIA:   general  EBL:  Total I/O In: 20 [I.V.:20] Out: -   BLOOD ADMINISTERED:none  DRAINS: none   LOCAL MEDICATIONS USED:  NONE  SPECIMEN:  No Specimen  DISPOSITION OF SPECIMEN:  N/A  COUNTS:  NO Closed procedure so no count required  TOURNIQUET:  * No tourniquets in log *  IMPLANTS: None  DICTATION: .Dragon Dictation patient was brought to the operating room and after adequate anesthesia was obtained appropriate patient identification and timeout procedures were completed.  Passive range of motion at the start of the case with the knee held in flexion was approximately 70 degrees with about 10 degree flexion contracture.  By pushing gently on the front of the knee with the knee in extension nearly full extension was obtained likely 2 to 3 degrees.  Bringing the knee up into flexion holding the proximal tibia and pushing backwards all palpable popping of adhesions was noted and flexion was brought back to 110 degrees.  PLAN OF CARE: Discharge to home after PACU  PATIENT DISPOSITION:  PACU - hemodynamically stable.

## 2018-03-13 NOTE — Anesthesia Post-op Follow-up Note (Signed)
Anesthesia QCDR form completed.        

## 2018-03-13 NOTE — Anesthesia Procedure Notes (Signed)
Procedure Name: LMA Insertion Date/Time: 03/13/2018 2:42 PM Performed by: Junious Silk, CRNA Pre-anesthesia Checklist: Patient identified, Patient being monitored, Timeout performed, Emergency Drugs available and Suction available Patient Re-evaluated:Patient Re-evaluated prior to induction Oxygen Delivery Method: Circle system utilized Preoxygenation: Pre-oxygenation with 100% oxygen Induction Type: IV induction Ventilation: Mask ventilation without difficulty LMA: LMA inserted LMA Size: 3.5 Tube type: Oral Number of attempts: 1 Placement Confirmation: positive ETCO2 and breath sounds checked- equal and bilateral Tube secured with: Tape Dental Injury: Teeth and Oropharynx as per pre-operative assessment

## 2018-03-13 NOTE — Anesthesia Preprocedure Evaluation (Signed)
Anesthesia Evaluation  Patient identified by MRN, date of birth, ID band Patient awake    Reviewed: Allergy & Precautions, NPO status , Patient's Chart, lab work & pertinent test results  History of Anesthesia Complications Negative for: history of anesthetic complications  Airway Mallampati: III       Dental   Pulmonary neg sleep apnea, neg COPD,           Cardiovascular (-) hypertension(-) Past MI and (-) CHF (-) dysrhythmias (-) Valvular Problems/Murmurs     Neuro/Psych neg Seizures    GI/Hepatic Neg liver ROS, GERD  Medicated and Controlled,  Endo/Other  neg diabetesHypothyroidism Morbid obesity  Renal/GU      Musculoskeletal   Abdominal   Peds  Hematology   Anesthesia Other Findings   Reproductive/Obstetrics                             Anesthesia Physical Anesthesia Plan  ASA: III  Anesthesia Plan: General   Post-op Pain Management:    Induction: Intravenous  PONV Risk Score and Plan: 3 and Dexamethasone, Ondansetron and Midazolam  Airway Management Planned: LMA and Oral ETT  Additional Equipment:   Intra-op Plan:   Post-operative Plan:   Informed Consent: I have reviewed the patients History and Physical, chart, labs and discussed the procedure including the risks, benefits and alternatives for the proposed anesthesia with the patient or authorized representative who has indicated his/her understanding and acceptance.       Plan Discussed with:   Anesthesia Plan Comments:         Anesthesia Quick Evaluation

## 2018-03-13 NOTE — Transfer of Care (Signed)
Immediate Anesthesia Transfer of Care Note  Patient: Samantha Avery  Procedure(s) Performed: CLOSED MANIPULATION KNEE-RIGHT INTERPRETER APPOINTMENT (Right )  Patient Location: PACU  Anesthesia Type:General  Level of Consciousness: awake and sedated  Airway & Oxygen Therapy: Patient Spontanous Breathing and Patient connected to face mask oxygen  Post-op Assessment: Report given to RN and Post -op Vital signs reviewed and stable  Post vital signs: Reviewed and stable  Last Vitals:  Vitals Value Taken Time  BP    Temp    Pulse    Resp    SpO2      Last Pain:  Vitals:   03/13/18 1329  TempSrc: Tympanic  PainSc: 4       Patients Stated Pain Goal: 3 (03/13/18 1329)  Complications: No apparent anesthesia complications

## 2018-03-13 NOTE — Discharge Instructions (Signed)
AMBULATORY SURGERY  DISCHARGE INSTRUCTIONS   1) The drugs that you were given will stay in your system until tomorrow so for the next 24 hours you should not:  A) Drive an automobile B) Make any legal decisions C) Drink any alcoholic beverage   2) You may resume regular meals tomorrow.  Today it is better to start with liquids and gradually work up to solid foods.  You may eat anything you prefer, but it is better to start with liquids, then soup and crackers, and gradually work up to solid foods.   3) Please notify your doctor immediately if you have any unusual bleeding, trouble breathing, redness and pain at the surgery site, drainage, fever, or pain not relieved by medication.    4) Additional Instructions:        Please contact your physician with any problems or Same Day Surgery at [phone], Monday through Friday 6 am to 4 pm, or [place] at [facility] number at [phone].Work on range of motion of the knee is much as possible for the next week with therapy and on your own.  Pain medicine as prescribed.

## 2018-03-13 NOTE — Anesthesia Postprocedure Evaluation (Signed)
Anesthesia Post Note  Patient: Samantha Avery  Procedure(s) Performed: CLOSED MANIPULATION KNEE-RIGHT INTERPRETER APPOINTMENT (Right )  Patient location during evaluation: PACU Anesthesia Type: General Level of consciousness: awake and alert Pain management: pain level controlled Vital Signs Assessment: post-procedure vital signs reviewed and stable Respiratory status: spontaneous breathing and respiratory function stable Cardiovascular status: stable Anesthetic complications: no     Last Vitals:  Vitals:   03/13/18 1548 03/13/18 1601  BP: (!) 164/99 (!) 162/88  Pulse: 76 73  Resp: 20 20  Temp: 36.9 C (!) 36.2 C  SpO2: 99% 100%    Last Pain:  Vitals:   03/13/18 1601  TempSrc: Temporal  PainSc: 5                  Khylen Riolo K

## 2018-03-13 NOTE — H&P (Signed)
Reviewed paper H+P, will be scanned into chart. No changes noted.  

## 2018-03-14 ENCOUNTER — Encounter: Payer: Self-pay | Admitting: Orthopedic Surgery

## 2018-04-26 ENCOUNTER — Encounter: Payer: Self-pay | Admitting: *Deleted

## 2018-04-26 ENCOUNTER — Emergency Department: Payer: 59

## 2018-04-26 ENCOUNTER — Other Ambulatory Visit: Payer: Self-pay

## 2018-04-26 ENCOUNTER — Emergency Department
Admission: EM | Admit: 2018-04-26 | Discharge: 2018-04-26 | Disposition: A | Discharge status: Home / Self Care | Payer: 59 | Attending: Emergency Medicine | Admitting: Emergency Medicine

## 2018-04-26 DIAGNOSIS — M546 Pain in thoracic spine: Secondary | ICD-10-CM

## 2018-04-26 DIAGNOSIS — M62830 Muscle spasm of back: Secondary | ICD-10-CM | POA: Diagnosis not present

## 2018-04-26 DIAGNOSIS — M549 Dorsalgia, unspecified: Secondary | ICD-10-CM | POA: Diagnosis present

## 2018-04-26 DIAGNOSIS — Z79899 Other long term (current) drug therapy: Secondary | ICD-10-CM | POA: Insufficient documentation

## 2018-04-26 DIAGNOSIS — E039 Hypothyroidism, unspecified: Secondary | ICD-10-CM | POA: Diagnosis not present

## 2018-04-26 DIAGNOSIS — Z96651 Presence of right artificial knee joint: Secondary | ICD-10-CM | POA: Diagnosis not present

## 2018-04-26 DIAGNOSIS — Z86711 Personal history of pulmonary embolism: Secondary | ICD-10-CM

## 2018-04-26 DIAGNOSIS — R0781 Pleurodynia: Secondary | ICD-10-CM | POA: Insufficient documentation

## 2018-04-26 LAB — URINALYSIS, COMPLETE (UACMP) WITH MICROSCOPIC
Bilirubin Urine: NEGATIVE
Glucose, UA: NEGATIVE mg/dL
Ketones, ur: NEGATIVE mg/dL
Leukocytes,Ua: NEGATIVE
Nitrite: NEGATIVE
Protein, ur: NEGATIVE mg/dL
RBC / HPF: >50 RBC/hpf — ABNORMAL HIGH (ref 0–5)
Specific Gravity, Urine: 1.009 (ref 1.005–1.030)
pH: 6 (ref 5.0–8.0)

## 2018-04-26 LAB — CBC
HCT: 38.5 % (ref 36.0–46.0)
Hemoglobin: 11.5 g/dL — ABNORMAL LOW (ref 12.0–15.0)
MCH: 25.4 pg — ABNORMAL LOW (ref 26.0–34.0)
MCHC: 29.9 g/dL — ABNORMAL LOW (ref 30.0–36.0)
MCV: 85.2 fL (ref 80.0–100.0)
Platelets: 348 10*3/uL (ref 150–400)
RBC: 4.52 MIL/uL (ref 3.87–5.11)
RDW: 16.7 % — ABNORMAL HIGH (ref 11.5–15.5)
WBC: 11.4 10*3/uL — ABNORMAL HIGH (ref 4.0–10.5)
nRBC: 0 % (ref 0.0–0.2)

## 2018-04-26 LAB — COMPREHENSIVE METABOLIC PANEL
ALT: 26 U/L (ref 0–44)
AST: 36 U/L (ref 15–41)
Albumin: 4.2 g/dL (ref 3.5–5.0)
Alkaline Phosphatase: 72 U/L (ref 38–126)
Anion gap: 11 (ref 5–15)
BUN: 9 mg/dL (ref 6–20)
CO2: 21 mmol/L — ABNORMAL LOW (ref 22–32)
Calcium: 9.1 mg/dL (ref 8.9–10.3)
Chloride: 105 mmol/L (ref 98–111)
Creatinine, Ser: 0.46 mg/dL (ref 0.44–1.00)
GFR calc Af Amer: >60 mL/min (ref >60)
GFR calc non Af Amer: >60 mL/min (ref >60)
Glucose, Bld: 97 mg/dL (ref 70–99)
Potassium: 4.4 mmol/L (ref 3.5–5.1)
Sodium: 137 mmol/L (ref 135–145)
Total Bilirubin: 0.3 mg/dL (ref 0.3–1.2)
Total Protein: 8.5 g/dL — ABNORMAL HIGH (ref 6.5–8.1)

## 2018-04-26 LAB — LIPASE, BLOOD: Lipase: 26 U/L (ref 11–51)

## 2018-04-26 MED ORDER — METHOCARBAMOL 500 MG PO TABS: 500.0000 mg | ORAL_TABLET | Freq: Four times a day (QID) | ORAL | 1 refills | Status: AC

## 2018-04-26 MED ORDER — ORPHENADRINE CITRATE 30 MG/ML IJ SOLN
60.0000 mg | Freq: Once | INTRAMUSCULAR | Status: AC
  Administered 2018-04-26: 60 mg via INTRAVENOUS
  Filled 2018-04-26: qty 2

## 2018-04-26 MED ORDER — IOHEXOL 350 MG/ML SOLN
75.0000 mL | Freq: Once | INTRAVENOUS | Status: AC | PRN
  Administered 2018-04-26: 19:00:00 75 mL via INTRAVENOUS
  Filled 2018-04-26: qty 75

## 2018-04-26 NOTE — Discharge Instructions (Addendum)
Please apply heating pad to the back.  Take muscle relaxers as needed for pain.  You may also use Tylenol for additional pain relief.  You may perform light stretching exercises.  If any increasing pain fevers chest pain or shortness of breath please return to the emergency department immediately.

## 2018-04-26 NOTE — ED Notes (Signed)
Pt to ED from [facility] right sided back and flank pain. KC reporting 10/10 pain that is tender to the touch. Pt reporting she took Oxycodone at 15:00 that relieved the pain slightly.

## 2018-04-26 NOTE — ED Triage Notes (Signed)
Pt to triage via wheelchair.  Pt reports right side back pain. No abd pain.  No n/v/d.  Denies urinary sx.  Pt drinking water in triage.  Pt took oxycontin at 240pm today.  Interpreter with pt.  Pt had knee replacement 2 months ago.

## 2018-04-26 NOTE — ED Notes (Signed)
Patient transported to X-ray 

## 2018-04-26 NOTE — ED Provider Notes (Signed)
[facility] EMERGENCY DEPARTMENT Provider Note   CSN: [id number] Arrival date & time: 04/26/18  1629    History   Chief Complaint Chief Complaint  Patient presents with  . Back Pain    HPI Samantha Avery is a 53 y.o. female presents to the emergency department for evaluation of right mid back pain.  Pain is 10 out of 10 and has been present for 3 days and increasing.  She points to pain along the right thoracic spine along T7-T8 paravertebral muscles.  Pain is sharp.  Pain increased with reaching forward. Patient also notes pain with taking a breath.  States she has returned to work 2 weeks ago from her total knee replacement has been doing a lot of repetitive motion but no heavy lifting.  Pain is not increased with eating.  She denies any nausea vomiting or urinary symptoms.  No fevers, trauma, injury.  No chest pain shortness of breath or coughing.  Patient took oxycodone 5 mg around 3 PM today with mild relief.  Oxycodone was left over from recent knee replacement surgery. Patient was diagnosed with a pulmonary embolism 01/28/2018 after TKA on 01/25/2018 and is currently on Eliquis 5 mg twice daily, last day of Eliquis today.  No previous history of VTE or clotting disorders.  Patient currently on her menses     HPI  Past Medical History:  Diagnosis Date  . Arthritis   . GERD (gastroesophageal reflux disease)   . Hypothyroidism   . Thyroid disease     Patient Active Problem List   Diagnosis Date Noted  . 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 csection     x2  . KNEE ARTHROSCOPY Right    meniscus  . KNEE CLOSED REDUCTION Right 03/13/2018   Procedure: CLOSED MANIPULATION KNEE-RIGHT INTERPRETER APPOINTMENT;  Surgeon: Kennedy Bucker, MD;  Location: [facility] ORS;  Service: Orthopedics;  Laterality: Right;  . TOTAL KNEE ARTHROPLASTY Right 01/25/2018   Procedure: TOTAL KNEE  ARTHROPLASTY;  Surgeon: Kennedy Bucker, MD;  Location: [facility] ORS;  Service: Orthopedics;  Laterality: Right;     OB History   No obstetric history on file.      Home Medications    Prior to Admission medications   Medication Sig Start Date End Date Taking? Authorizing Provider  acetaminophen (TYLENOL) 650 MG CR tablet Take 650 mg by mouth every 8 (eight) hours as needed for pain.    [provider]  apixaban (ELIQUIS) 5 MG TABS tablet Take 1 tablet (5 mg total) by mouth 2 (two) times daily. 01/29/18   Evon Slack, PA-C  bisacodyl (DULCOLAX) 5 MG EC tablet Take 1 tablet (5 mg total) by mouth daily as needed for moderate constipation. 01/26/18   Evon Slack, PA-C  levothyroxine (SYNTHROID, LEVOTHROID) 25 MCG tablet Take 25 mcg by mouth daily before breakfast.     [provider]  methocarbamol (ROBAXIN) 500 MG tablet Take 1 tablet (500 mg total) by mouth 4 (four) times daily. 04/26/18   Evon Slack, PA-C  norethindrone (AYGESTIN) 5 MG tablet Take 10 mg by mouth daily.     [provider]  omeprazole (PRILOSEC) 20 MG capsule Take 20 mg by mouth daily as needed (heartburn).     [provider]  oxyCODONE (OXY IR/ROXICODONE) 5 MG immediate release tablet Take 1-2 tablets (5-10 mg total) by mouth every 4 (four) hours as needed  for moderate pain (pain score 4-6). 01/26/18   Evon Slack, PA-C  oxyCODONE (ROXICODONE) 5 MG immediate release tablet Take 1-2 tablets (5-10 mg total) by mouth every 4 (four) hours as needed. 03/13/18 03/13/19  Kennedy Bucker, MD    Family History Family History  Problem Relation Age of Onset  . Uterine cancer Mother     Social History Social History   Tobacco Use  . Smoking status: Never Smoker  . Smokeless tobacco: Never Used  Substance Use Topics  . Alcohol use: Yes    Comment: rare  . Drug use: Never     Allergies   Patient has no known allergies.   Review of Systems Review of Systems  Constitutional:  Negative for activity change, chills, fatigue and fever.  HENT: Negative for congestion, sinus pressure and sore throat.   Eyes: Negative for visual disturbance.  Respiratory: Negative for cough, chest tightness and shortness of breath.   Cardiovascular: Negative for chest pain and leg swelling.  Gastrointestinal: Negative for abdominal pain, diarrhea, nausea and vomiting.  Genitourinary: Positive for flank pain. Negative for decreased urine volume, difficulty urinating, dysuria, frequency, pelvic pain, vaginal discharge and vaginal pain.  Musculoskeletal: Positive for back pain and myalgias. Negative for arthralgias and gait problem.  Skin: Negative for rash and wound.  Neurological: Negative for weakness, numbness and headaches.  Hematological: Negative for adenopathy.  Psychiatric/Behavioral: Negative for agitation, behavioral problems and confusion.     Physical Exam Updated Vital Signs BP (!) 172/85 (BP Location: Right Arm)   Pulse 76   Temp 98.9 F (37.2 C) (Oral)   Resp 15   Ht 5' (1.524 m)   Wt 112.9 kg   LMP 04/26/2018 (Exact Date)   SpO2 97%   BMI 48.63 kg/m   Physical Exam Constitutional:      Appearance: She is well-developed.  HENT:     Head: Normocephalic and atraumatic.  Eyes:     Conjunctiva/sclera: Conjunctivae normal.  Neck:     Musculoskeletal: Normal range of motion. No neck rigidity.  Cardiovascular:     Rate and Rhythm: Normal rate.     Pulses: Normal pulses.  Pulmonary:     Effort: Pulmonary effort is normal. No respiratory distress.     Breath sounds: Normal breath sounds. No stridor. No wheezing, rhonchi or rales.  Chest:     Chest wall: No tenderness.  Abdominal:     General: Abdomen is flat. There is no distension.     Tenderness: There is no abdominal tenderness. There is right CVA tenderness. There is no left CVA tenderness or guarding.  Musculoskeletal: Normal range of motion.     Comments: Patient tender along the right medial and  inferior border of the scapula.  She is full active range of motion of the right shoulder with no weakness with abduction, internal and external rotation resistance.  She has pain mostly with forward reaching reproducing pain in her right thoracic paravertebral muscles.  No swelling or edema in the lower extremities.  Negative Homans sign bilaterally.  Skin:    General: Skin is warm.     Findings: No rash.  Neurological:     General: No focal deficit present.     Mental Status: She is alert and oriented to person, place, and time.  Psychiatric:        Behavior: Behavior normal.        Thought Content: Thought content normal.      ED Treatments / Results  Labs (  all labs ordered are listed, but only abnormal results are displayed) Labs Reviewed  COMPREHENSIVE METABOLIC PANEL - Abnormal; Notable for the following components:      Result Value   CO2 21 (*)    Total Protein 8.5 (*)    All other components within normal limits  CBC - Abnormal; Notable for the following components:   WBC 11.4 (*)    Hemoglobin 11.5 (*)    MCH 25.4 (*)    MCHC 29.9 (*)    RDW 16.7 (*)    All other components within normal limits  URINALYSIS, COMPLETE (UACMP) WITH MICROSCOPIC - Abnormal; Notable for the following components:   Color, Urine STRAW (*)    APPearance CLEAR (*)    Hgb urine dipstick LARGE (*)    RBC / HPF >50 (*)    Bacteria, UA RARE (*)    All other components within normal limits  LIPASE, BLOOD    EKG None  Radiology Dg Chest 2 View  Result Date: 04/26/2018 CLINICAL DATA:  Right-sided back pain. EXAM: CHEST - 2 VIEW COMPARISON:  Chest CT 01/28/2018.  Chest x-ray 01/09/2018 FINDINGS: The lungs are clear without focal pneumonia, edema, pneumothorax or pleural effusion. Small right lung nodule compatible with calcified granuloma seen on previous chest CT. The cardiopericardial silhouette is within normal limits for size. The visualized bony structures of the thorax are intact.  IMPRESSION: No active cardiopulmonary disease. Electronically Signed   By: Kennith CenterEric  Mansell M.D.   On: 04/26/2018 18:26   Dg Thoracic Spine 2 View  Result Date: 04/26/2018 CLINICAL DATA:  Right-sided back pain. EXAM: THORACIC SPINE 2 VIEWS COMPARISON:  None. FINDINGS: There is no evidence of thoracic spine fracture. Alignment is normal. No other significant bone abnormalities are identified. IMPRESSION: Negative. Electronically Signed   By: Kennith CenterEric  Mansell M.D.   On: 04/26/2018 18:27   Dg Lumbar Spine 2-3 Views  Result Date: 04/26/2018 CLINICAL DATA:  Right-sided back pain. EXAM: LUMBAR SPINE - 2-3 VIEW COMPARISON:  None. FINDINGS: There is no evidence of lumbar spine fracture. Alignment is normal. Intervertebral disc spaces are maintained. IMPRESSION: Negative. Electronically Signed   By: Kennith CenterEric  Mansell M.D.   On: 04/26/2018 18:28   Ct Angio Chest Pe W Or Wo Contrast  Result Date: 04/26/2018 CLINICAL DATA:  Shortness of breath EXAM: CT ANGIOGRAPHY CHEST WITH CONTRAST TECHNIQUE: Multidetector CT imaging of the chest was performed using the standard protocol during bolus administration of intravenous contrast. Multiplanar CT image reconstructions and MIPs were obtained to evaluate the vascular anatomy. CONTRAST:  75mL OMNIPAQUE IOHEXOL 350 MG/ML SOLN COMPARISON:  Chest radiograph April 26, 2018 FINDINGS: Cardiovascular: There is no demonstrable pulmonary embolus. There is no appreciable thoracic aortic aneurysm or dissection. Visualized great vessels appear unremarkable. There is no pericardial effusion or pericardial thickening. Mediastinum/Nodes: Thyroid appears unremarkable. There is a right pretracheal lymph node near the level of the carina measuring 1.2 x 1.3 cm. There is a subcarinal lymph node measuring 1.1 x 1.1 cm. There is a lymph node in the right hilum measuring 1.3 x 1.1 cm. There is a fairly small hiatal hernia. Lungs/Pleura: There is atelectatic change in the left lower lobe. There is no edema or  consolidation. There is no appreciable pleural effusion or pleural thickening. Upper Abdomen: There is hepatic steatosis. Visualized upper abdominal structures otherwise appear unremarkable. Musculoskeletal: There is degenerative change in the lower thoracic region. There are no blastic or lytic bone lesions. No chest wall lesions are evident. Review of the  MIP images confirms the above findings. IMPRESSION: 1. No demonstrable pulmonary embolus. No thoracic aortic aneurysm or dissection. 2.  Mild atelectatic change.  No edema or consolidation. 3.  Several mildly prominent lymph nodes of uncertain etiology. 4.  Focal fairly small hiatal hernia. 5.  Hepatic steatosis. Electronically Signed   By: Bretta Bang III M.D.   On: 04/26/2018 19:12    Procedures Procedures (including critical care time)  Medications Ordered in ED Medications  orphenadrine (NORFLEX) injection 60 mg (60 mg Intravenous Given 04/26/18 1733)  iohexol (OMNIPAQUE) 350 MG/ML injection 75 mL (75 mLs Intravenous Contrast Given 04/26/18 1856)     Initial Impression / Assessment and Plan / ED Course  I have reviewed the triage vital signs and the nursing notes.  Pertinent labs & imaging results that were available during my care of the patient were reviewed by me and considered in my medical decision making (see chart for details).        53 year old female with history of pulmonary embolism immediately following TKA 3 months ago who is currently on Eliquis presents with acute right-sided back pain with pain with taking a deep breath.  No trauma or injury.  Vital signs are stable and within normal limits.  CMP within normal limits, slight elevation of white count of 11.4.  Urinalysis showed no signs of infection, hematuria present but this likely due to patient menses.  Most of patient's pain located in the mid to upper thoracic region on the right side.  Due to mild dyspnea and recent PE CT angios of the chest obtained showing no  recurrent PE.  Patient was given IV Norflex in the emergency department and saw significant provement of back pain from a 10 down to a 3.  Patient diagnosed with musculoskeletal thoracic back pain.  Vital signs are stable.  Pain much improved with muscle relaxer.  She understands signs symptoms return to the ED for such as any chest pain, shortness of breath, worsening pain.  Patient is given prescription for methocarbamol. Final Clinical Impressions(s) / ED Diagnoses   Final diagnoses:  Muscle spasm of back  Acute right-sided thoracic back pain  History of pulmonary embolism  History of total right knee replacement    ED Discharge Orders         Ordered    methocarbamol (ROBAXIN) 500 MG tablet  4 times daily     04/26/18 1933           Ronnette Juniper 04/26/18 1941    Dionne Bucy, MD 04/26/18 2312

## 2018-04-26 NOTE — ED Notes (Signed)
PA to bedside; pt provided updated plan of care.

## 2018-04-26 NOTE — ED Notes (Signed)
Patient transported to CT 

## 2019-05-31 IMAGING — CT CT ANGIOGRAPHY CHEST
2 of 6 series · 18 of 46 positions shown · IV contrast (APPLIED)
Comparison: Chest radiograph April 26, 2018

CLINICAL DATA: Shortness of breath

EXAM:
CT ANGIOGRAPHY CHEST WITH CONTRAST
TECHNIQUE: Multidetector CT imaging of the chest was performed using the
standard protocol during bolus administration of intravenous
contrast. Multiplanar CT image reconstructions and MIPs were
obtained to evaluate the vascular anatomy.
CONTRAST:  75mL OMNIPAQUE IOHEXOL 350 MG/ML SOLN

[Series 5: thins · axial · 0.74mm/px · z∈[-213,+47]mm · 16 of 286 slices shown]
[im 13/286  lung]
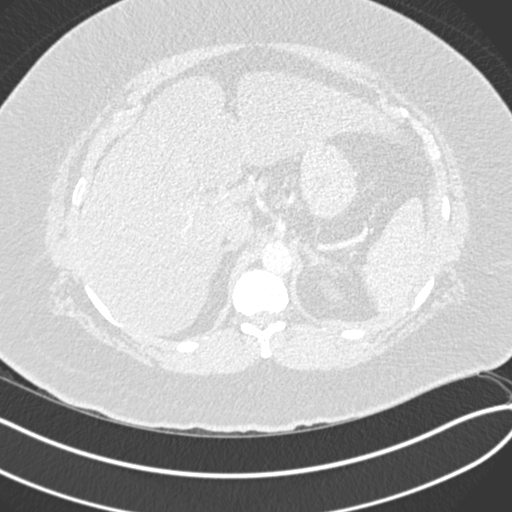
[im 38/286  soft-tissue]
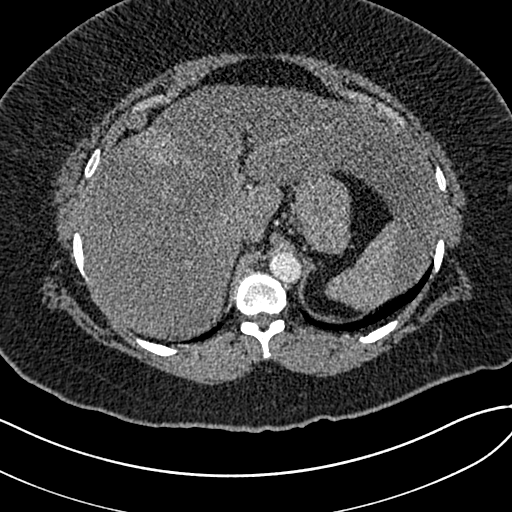
[im 50/286  lung]
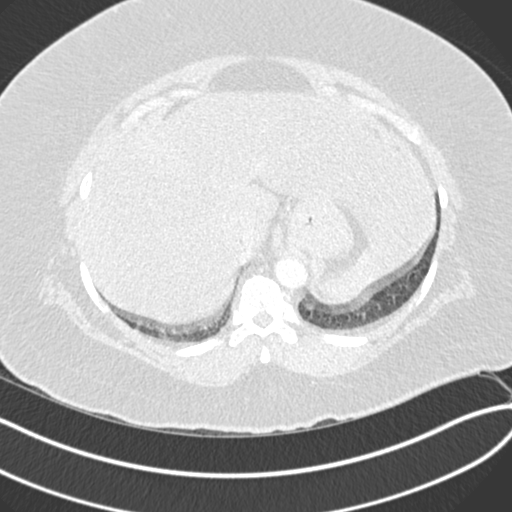
[im 62/286  soft-tissue]
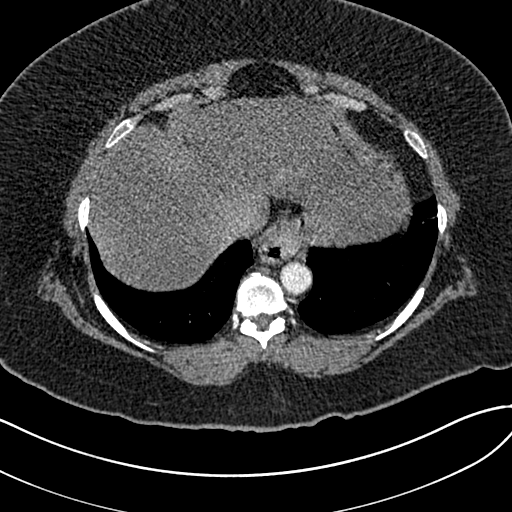
[im 87/286  lung]
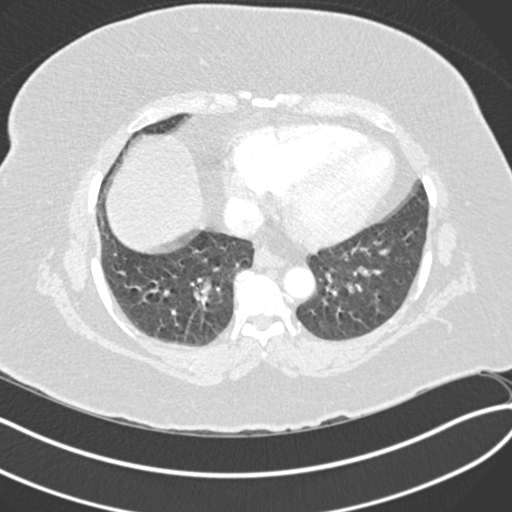
[im 100/286  soft-tissue]
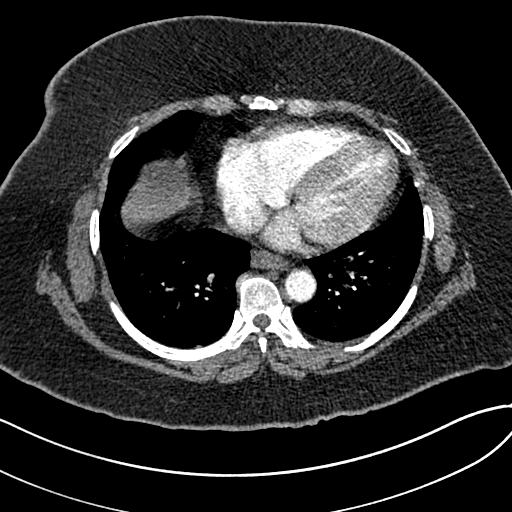
[im 112/286  lung]
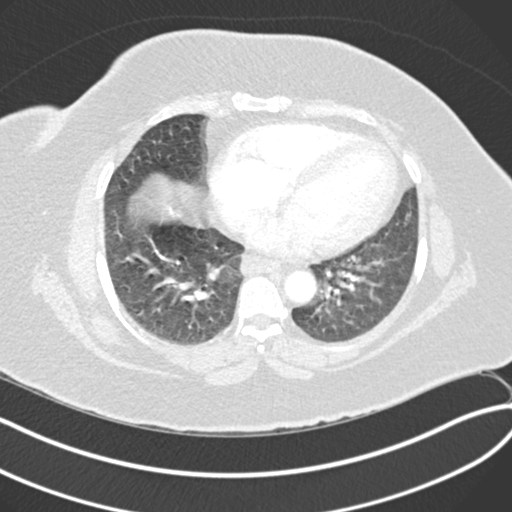
[im 137/286  soft-tissue]
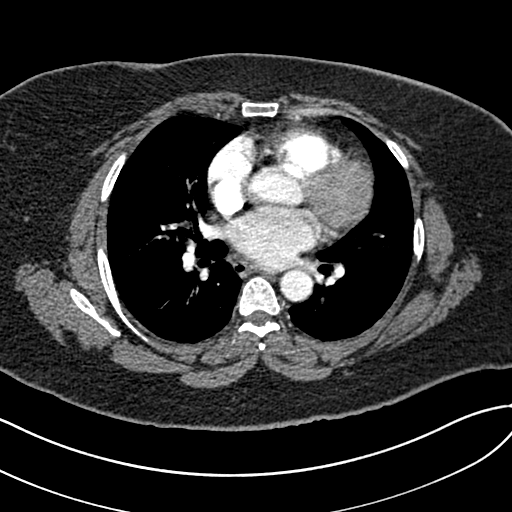
[im 149/286  lung]
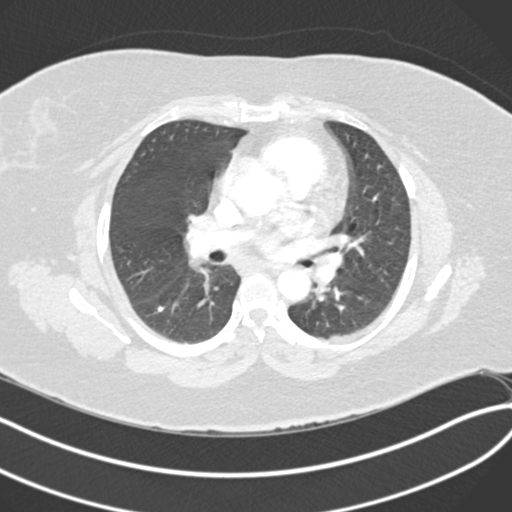
[im 174/286  soft-tissue]
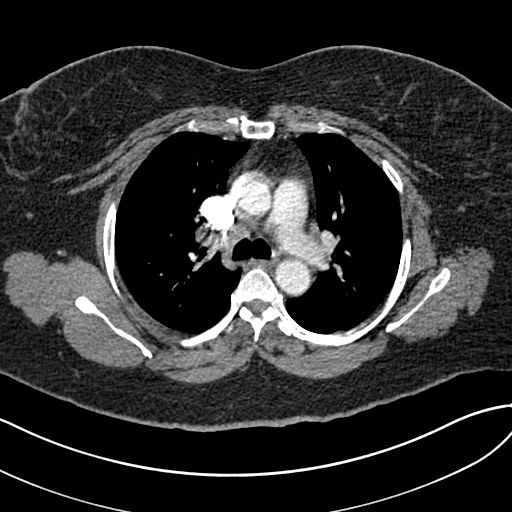
[im 186/286  lung]
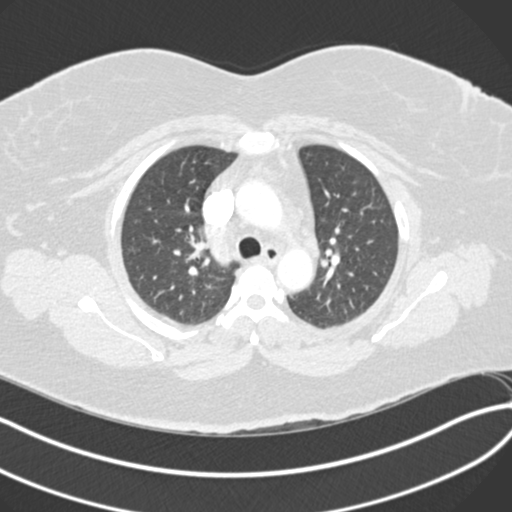
[im 199/286  soft-tissue]
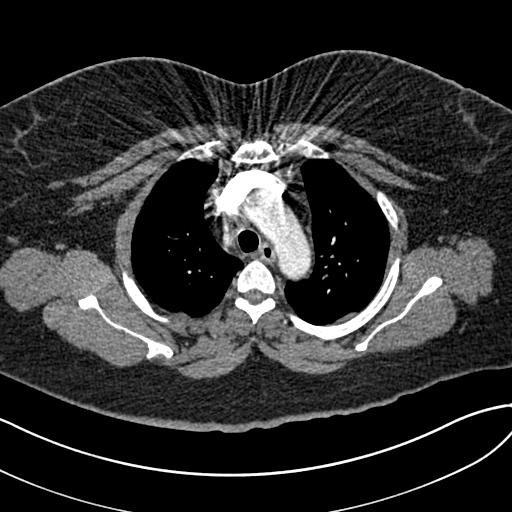
[im 224/286  lung]
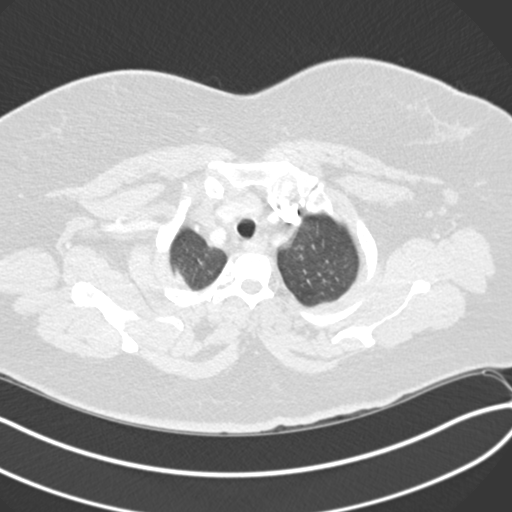
[im 236/286  soft-tissue]
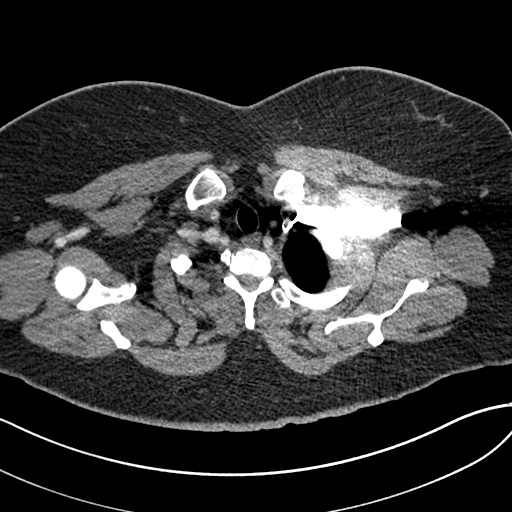
[im 248/286  lung]
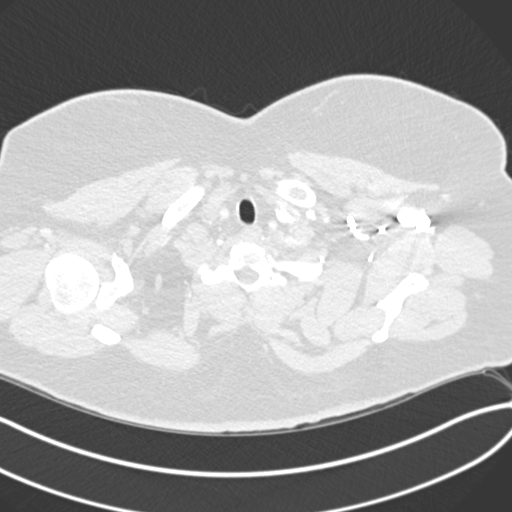
[im 273/286  soft-tissue]
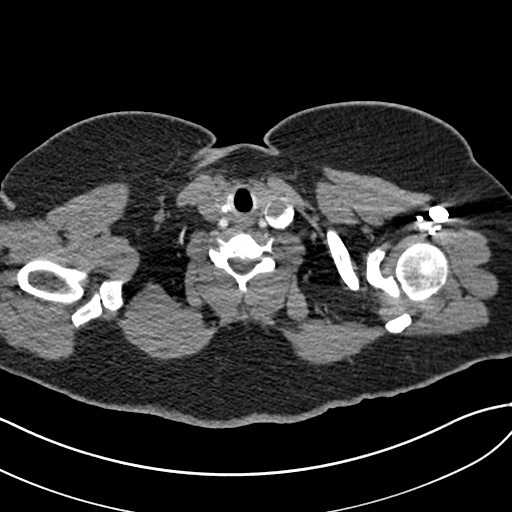

[Series 7: coronal mpr · coronal · 0.56mm/px · 2 of 90 slices shown]
[im 30/90  soft-tissue]
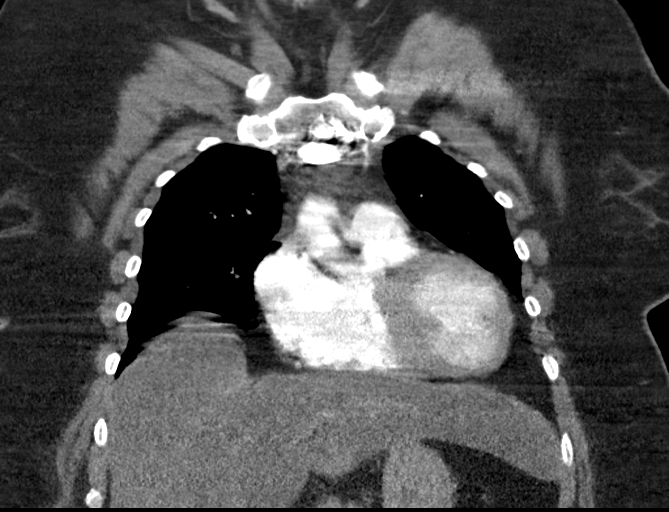
[im 60/90  soft-tissue]
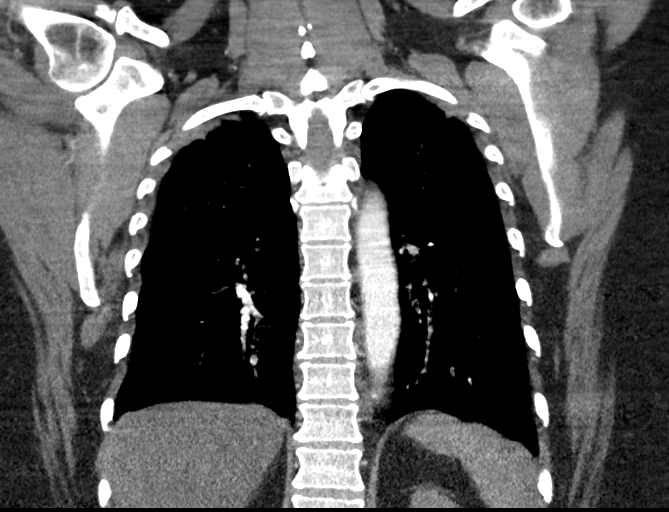

[18 of 46 positions shown; findings below may reference images not displayed]

FINDINGS: Cardiovascular: There is no demonstrable pulmonary embolus. There is
no appreciable thoracic aortic aneurysm or dissection. Visualized
great vessels appear unremarkable. There is no pericardial effusion
or pericardial thickening.

Mediastinum/Nodes: Thyroid appears unremarkable. There is a right
pretracheal lymph node near the level of the carina measuring 1.2 x
1.3 cm. There is a subcarinal lymph node measuring 1.1 x 1.1 cm.
There is a lymph node in the right hilum measuring 1.3 x 1.1 cm.
There is a fairly small hiatal hernia.

Lungs/Pleura: There is atelectatic change in the left lower lobe.
There is no edema or consolidation. There is no appreciable pleural
effusion or pleural thickening.

Upper Abdomen: There is hepatic steatosis. Visualized upper
abdominal structures otherwise appear unremarkable.

Musculoskeletal: There is degenerative change in the lower thoracic
region. There are no blastic or lytic bone lesions. No chest wall
lesions are evident.

Review of the MIP images confirms the above findings.
IMPRESSION: 1. No demonstrable pulmonary embolus. No thoracic aortic aneurysm or
dissection.

2.  Mild atelectatic change.  No edema or consolidation.

3.  Several mildly prominent lymph nodes of uncertain etiology.

4.  Focal fairly small hiatal hernia.

5.  Hepatic steatosis.

## 2019-06-04 ENCOUNTER — Other Ambulatory Visit: Payer: Self-pay

## 2019-06-04 ENCOUNTER — Ambulatory Visit
Admission: RE | Admit: 2019-06-04 | Discharge: 2019-06-04 | Disposition: A | Discharge status: Home / Self Care | Payer: 59 | Attending: Family Medicine | Admitting: Family Medicine

## 2019-06-04 ENCOUNTER — Ambulatory Visit
Admission: RE | Admit: 2019-06-04 | Discharge: 2019-06-04 | Disposition: A | Discharge status: Home / Self Care | Payer: 59 | Source: Ambulatory Visit | Attending: Family Medicine | Admitting: Family Medicine

## 2019-06-04 ENCOUNTER — Other Ambulatory Visit: Payer: Self-pay | Admitting: Family Medicine

## 2019-06-04 DIAGNOSIS — Z09 Encounter for follow-up examination after completed treatment for conditions other than malignant neoplasm: Secondary | ICD-10-CM | POA: Diagnosis not present
